# Patient Record
Sex: Male | Born: 1995 | Race: White | Hispanic: No | Marital: Single | Smoking: Current every day smoker
Health system: Southern US, Community
[De-identification: ages and names within clinical notes are randomized; demographics above are authoritative.]

---
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
## 2013-10-15 ENCOUNTER — Emergency Department: Payer: Self-pay | Admitting: Emergency Medicine

## 2014-05-22 ENCOUNTER — Emergency Department: Payer: Self-pay | Admitting: Student

## 2014-05-22 LAB — DRUG SCREEN, URINE
Amphetamines, Ur Screen: NEGATIVE (ref ?–1000)
BARBITURATES, UR SCREEN: NEGATIVE (ref ?–200)
BENZODIAZEPINE, UR SCRN: NEGATIVE (ref ?–200)
Cannabinoid 50 Ng, Ur ~~LOC~~: POSITIVE (ref ?–50)
Cocaine Metabolite,Ur ~~LOC~~: NEGATIVE (ref ?–300)
MDMA (Ecstasy)Ur Screen: NEGATIVE (ref ?–500)
Methadone, Ur Screen: NEGATIVE (ref ?–300)
Opiate, Ur Screen: NEGATIVE (ref ?–300)
Phencyclidine (PCP) Ur S: NEGATIVE (ref ?–25)
TRICYCLIC, UR SCREEN: NEGATIVE (ref ?–1000)

## 2014-05-22 LAB — URINALYSIS, COMPLETE
BILIRUBIN, UR: NEGATIVE
Bacteria: NONE SEEN
Blood: NEGATIVE
Glucose,UR: NEGATIVE mg/dL (ref 0–75)
Ketone: NEGATIVE
Leukocyte Esterase: NEGATIVE
NITRITE: NEGATIVE
Ph: 5 (ref 4.5–8.0)
Protein: NEGATIVE
Specific Gravity: 1.029 (ref 1.003–1.030)
WBC UR: 4 /HPF (ref 0–5)

## 2014-05-22 LAB — ETHANOL

## 2014-05-22 LAB — CBC
HCT: 51.2 % (ref 40.0–52.0)
HGB: 16.3 g/dL (ref 13.0–18.0)
MCH: 26.6 pg (ref 26.0–34.0)
MCHC: 31.9 g/dL — ABNORMAL LOW (ref 32.0–36.0)
MCV: 83 fL (ref 80–100)
Platelet: 192 10*3/uL (ref 150–440)
RBC: 6.14 10*6/uL — AB (ref 4.40–5.90)
RDW: 12.9 % (ref 11.5–14.5)
WBC: 8.3 10*3/uL (ref 3.8–10.6)

## 2014-05-22 LAB — COMPREHENSIVE METABOLIC PANEL
ALK PHOS: 94 U/L
ALT: 34 U/L
Albumin: 4.7 g/dL (ref 3.4–5.0)
Anion Gap: 6 — ABNORMAL LOW (ref 7–16)
BUN: 18 mg/dL (ref 7–18)
Bilirubin,Total: 0.6 mg/dL (ref 0.2–1.0)
CREATININE: 1.08 mg/dL (ref 0.60–1.30)
Calcium, Total: 9.2 mg/dL (ref 8.5–10.1)
Chloride: 105 mmol/L (ref 98–107)
Co2: 25 mmol/L (ref 21–32)
EGFR (African American): >60
EGFR (Non-African Amer.): >60
GLUCOSE: 101 mg/dL — AB (ref 65–99)
OSMOLALITY: 274 (ref 275–301)
Potassium: 4.1 mmol/L (ref 3.5–5.1)
SGOT(AST): 22 U/L (ref 15–37)
SODIUM: 136 mmol/L (ref 136–145)
Total Protein: 8.1 g/dL (ref 6.4–8.2)

## 2014-05-22 LAB — ACETAMINOPHEN LEVEL: Acetaminophen: <2 ug/mL

## 2014-05-22 LAB — SALICYLATE LEVEL: Salicylates, Serum: <1.7 mg/dL

## 2014-09-30 ENCOUNTER — Emergency Department: Payer: Self-pay | Admitting: Emergency Medicine

## 2014-10-02 LAB — HM HIV SCREENING LAB: HM HIV Screening: NEGATIVE

## 2014-10-11 DIAGNOSIS — B009 Herpesviral infection, unspecified: Secondary | ICD-10-CM | POA: Insufficient documentation
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
## 2015-02-01 ENCOUNTER — Encounter: Payer: Self-pay | Admitting: Emergency Medicine

## 2015-02-01 ENCOUNTER — Emergency Department: Payer: Self-pay

## 2015-02-01 ENCOUNTER — Emergency Department
Admission: EM | Admit: 2015-02-01 | Discharge: 2015-02-01 | Disposition: A | Discharge status: Home / Self Care | Payer: Self-pay | Attending: Emergency Medicine | Admitting: Emergency Medicine

## 2015-02-01 DIAGNOSIS — H05231 Hemorrhage of right orbit: Secondary | ICD-10-CM

## 2015-02-01 DIAGNOSIS — Y9389 Activity, other specified: Secondary | ICD-10-CM | POA: Insufficient documentation

## 2015-02-01 DIAGNOSIS — S060X0A Concussion without loss of consciousness, initial encounter: Secondary | ICD-10-CM

## 2015-02-01 DIAGNOSIS — S022XXA Fracture of nasal bones, initial encounter for closed fracture: Secondary | ICD-10-CM

## 2015-02-01 DIAGNOSIS — Z720 Tobacco use: Secondary | ICD-10-CM | POA: Insufficient documentation

## 2015-02-01 DIAGNOSIS — S0121XA Laceration without foreign body of nose, initial encounter: Secondary | ICD-10-CM

## 2015-02-01 DIAGNOSIS — Y998 Other external cause status: Secondary | ICD-10-CM | POA: Insufficient documentation

## 2015-02-01 DIAGNOSIS — Y9289 Other specified places as the place of occurrence of the external cause: Secondary | ICD-10-CM | POA: Insufficient documentation

## 2015-02-01 MED ORDER — ACETAMINOPHEN 500 MG PO TABS
1000.0000 mg | ORAL_TABLET | Freq: Once | ORAL | Status: AC
  Administered 2015-02-01: 1000 mg via ORAL

## 2015-02-01 MED ORDER — ACETAMINOPHEN 500 MG PO TABS
ORAL_TABLET | ORAL | Status: AC
  Filled 2015-02-01: qty 2

## 2015-02-01 MED ORDER — TRAMADOL HCL 50 MG PO TABS: 50.0000 mg | ORAL_TABLET | Freq: Four times a day (QID) | ORAL | Status: DC | PRN

## 2015-02-01 NOTE — ED Notes (Signed)
Pt alert and oriented X4, active, cooperative, pt in NAD. RR even and unlabored, color WNL.  Pt informed to return if any life threatening symptoms occur.   

## 2015-02-01 NOTE — ED Notes (Signed)
Left 20/30 Right 20/25
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
## 2015-02-01 NOTE — Discharge Instructions (Signed)
Concussion A concussion, or closed-head injury, is a brain injury caused by a direct blow to the head or by a quick and sudden movement (jolt) of the head or neck. Concussions are usually not life-threatening. Even so, the effects of a concussion can be serious. If you have had a concussion before, you are more likely to experience concussion-like symptoms after a direct blow to the head.  CAUSES  Direct blow to the head, such as from running into another player during a soccer game, being hit in a fight, or hitting your head on a hard surface.  A jolt of the head or neck that causes the brain to move back and forth inside the skull, such as in a car crash. SIGNS AND SYMPTOMS The signs of a concussion can be hard to notice. Early on, they may be missed by you, family members, and health care providers. You may look fine but act or feel differently. Symptoms are usually temporary, but they may last for days, weeks, or even longer. Some symptoms may appear right away while others may not show up for hours or days. Every head injury is different. Symptoms include:  Mild to moderate headaches that will not go away.  A feeling of pressure inside your head.  Having more trouble than usual:  Learning or remembering things you have heard.  Answering questions.  Paying attention or concentrating.  Organizing daily tasks.  Making decisions and solving problems.  Slowness in thinking, acting or reacting, speaking, or reading.  Getting lost or being easily confused.  Feeling tired all the time or lacking energy (fatigued).  Feeling drowsy.  Sleep disturbances.  Sleeping more than usual.  Sleeping less than usual.  Trouble falling asleep.  Trouble sleeping (insomnia).  Loss of balance or feeling lightheaded or dizzy.  Nausea or vomiting.  Numbness or tingling.  Increased sensitivity to:  Sounds.  Lights.  Distractions.  Vision problems or eyes that tire  easily.  Diminished sense of taste or smell.  Ringing in the ears.  Mood changes such as feeling sad or anxious.  Becoming easily irritated or angry for little or no reason.  Lack of motivation.  Seeing or hearing things other people do not see or hear (hallucinations). DIAGNOSIS Your health care provider can usually diagnose a concussion based on a description of your injury and symptoms. He or she will ask whether you passed out (lost consciousness) and whether you are having trouble remembering events that happened right before and during your injury. Your evaluation might include:  A brain scan to look for signs of injury to the brain. Even if the test shows no injury, you may still have a concussion.  Blood tests to be sure other problems are not present. TREATMENT  Concussions are usually treated in an emergency department, in urgent care, or at a clinic. You may need to stay in the hospital overnight for further treatment.  Tell your health care provider if you are taking any medicines, including prescription medicines, over-the-counter medicines, and natural remedies. Some medicines, such as blood thinners (anticoagulants) and aspirin, may increase the chance of complications. Also tell your health care provider whether you have had alcohol or are taking illegal drugs. This information may affect treatment.  Your health care provider will send you home with important instructions to follow.  How fast you will recover from a concussion depends on many factors. These factors include how severe your concussion is, what part of your brain was injured, your  age, and how healthy you were before the concussion. °· Most people with mild injuries recover fully. Recovery can take time. In general, recovery is slower in older persons. Also, persons who have had a concussion in the past or have other medical problems may find that it takes longer to recover from their current injury. °HOME  CARE INSTRUCTIONS °General Instructions °· Carefully follow the directions your health care provider gave you. °· Only take over-the-counter or prescription medicines for pain, discomfort, or fever as directed by your health care provider. °· Take only those medicines that your health care provider has approved. °· Do not drink alcohol until your health care provider says you are well enough to do so. Alcohol and certain other drugs may slow your recovery and can put you at risk of further injury. °· If it is harder than usual to remember things, write them down. °· If you are easily distracted, try to do one thing at a time. For example, do not try to watch TV while fixing dinner. °· Talk with family members or close friends when making important decisions. °· Keep all follow-up appointments. Repeated evaluation of your symptoms is recommended for your recovery. °· Watch your symptoms and tell others to do the same. Complications sometimes occur after a concussion. Older adults with a brain injury may have a higher risk of serious complications, such as a blood clot on the brain. °· Tell your teachers, school nurse, school counselor, coach, athletic trainer, or work manager about your injury, symptoms, and restrictions. Tell them about what you can or cannot do. They should watch for: °¨ Increased problems with attention or concentration. °¨ Increased difficulty remembering or learning new information. °¨ Increased time needed to complete tasks or assignments. °¨ Increased irritability or decreased ability to cope with stress. °¨ Increased symptoms. °· Rest. Rest helps the brain to heal. Make sure you: °¨ Get plenty of sleep at night. Avoid staying up late at night. °¨ Keep the same bedtime hours on weekends and weekdays. °¨ Rest during the day. Take daytime naps or rest breaks when you feel tired. °· Limit activities that require a lot of thought or concentration. These include: °¨ Doing homework or job-related  work. °¨ Watching TV. °¨ Working on the computer. °· Avoid any situation where there is potential for another head injury (football, hockey, soccer, basketball, martial arts, downhill snow sports and horseback riding). Your condition will get worse every time you experience a concussion. You should avoid these activities until you are evaluated by the appropriate follow-up health care providers. °Returning To Your Regular Activities °You will need to return to your normal activities slowly, not all at once. You must give your body and brain enough time for recovery. °· Do not return to sports or other athletic activities until your health care provider tells you it is safe to do so. °· Ask your health care provider when you can drive, ride a bicycle, or operate heavy machinery. Your ability to react may be slower after a brain injury. Never do these activities if you are dizzy. °· Ask your health care provider about when you can return to work or school. °Preventing Another Concussion °It is very important to avoid another brain injury, especially before you have recovered. In rare cases, another injury can lead to permanent brain damage, brain swelling, or death. The risk of this is greatest during the first 7-10 days after a head injury. Avoid injuries by: °· Wearing a seat   belt when riding in a car.  Drinking alcohol only in moderation.  Wearing a helmet when biking, skiing, skateboarding, skating, or doing similar activities.  Avoiding activities that could lead to a second concussion, such as contact or recreational sports, until your health care provider says it is okay.  Taking safety measures in your home.  Remove clutter and tripping hazards from floors and stairways.  Use grab bars in bathrooms and handrails by stairs.  Place non-slip mats on floors and in bathtubs.  Improve lighting in dim areas. SEEK MEDICAL CARE IF:  You have increased problems paying attention or  concentrating.  You have increased difficulty remembering or learning new information.  You need more time to complete tasks or assignments than before.  You have increased irritability or decreased ability to cope with stress.  You have more symptoms than before. Seek medical care if you have any of the following symptoms for more than 2 weeks after your injury:  Lasting (chronic) headaches.  Dizziness or balance problems.  Nausea.  Vision problems.  Increased sensitivity to noise or light.  Depression or mood swings.  Anxiety or irritability.  Memory problems.  Difficulty concentrating or paying attention.  Sleep problems.  Feeling tired all the time. SEEK IMMEDIATE MEDICAL CARE IF:  You have severe or worsening headaches. These may be a sign of a blood clot in the brain.  You have weakness (even if only in one hand, leg, or part of the face).  You have numbness.  You have decreased coordination.  You vomit repeatedly.  You have increased sleepiness.  One pupil is larger than the other.  You have convulsions.  You have slurred speech.  You have increased confusion. This may be a sign of a blood clot in the brain.  You have increased restlessness, agitation, or irritability.  You are unable to recognize people or places.  You have neck pain.  It is difficult to wake you up.  You have unusual behavior changes.  You lose consciousness. MAKE SURE YOU:  Understand these instructions.  Will watch your condition.  Will get help right away if you are not doing well or get worse. Document Released: 10/30/2003 Document Revised: 08/14/2013 Document Reviewed: 03/01/2013 [facility] Patient Information 2015 [place]. This information is not intended to replace advice given to you by your health care provider. Make sure you discuss any questions you have with your health care provider.  Nasal Fracture A nasal fracture is a break or crack in the  bones of the nose. A minor break usually heals in a month. You often will receive black eyes from a nasal fracture. This is not a cause for concern. The black eyes will go away over 1 to 2 weeks.  DIAGNOSIS  Your caregiver may want to examine you if you are concerned about a fracture of the nose. X-rays of the nose may not show a nasal fracture even when one is present. Sometimes your caregiver must wait 1 to 5 days after the injury to re-check the nose for alignment and to take additional X-rays. Sometimes the caregiver must wait until the swelling has gone down. TREATMENT Minor fractures that have caused no deformity often do not require treatment. More serious fractures where bones are displaced may require surgery. This will take place after the swelling is gone. Surgery will stabilize and align the fracture. HOME CARE INSTRUCTIONS   Put ice on the injured area.  Put ice in a plastic bag.  Place a  towel between your skin and the bag.  Leave the ice on for 15-20 minutes, 03-04 times a day.  Take medications as directed by your caregiver.  Only take over-the-counter or prescription medicines for pain, discomfort, or fever as directed by your caregiver.  If your nose starts bleeding, squeeze the soft parts of the nose against the center wall while you are sitting in an upright position for 10 minutes.  Contact sports should be avoided for at least 3 to 4 weeks or as directed by your caregiver. SEEK MEDICAL CARE IF:  Your pain increases or becomes severe.  You continue to have nosebleeds.  The shape of your nose does not return to normal within 5 days.  You have pus draining from the nose. SEEK IMMEDIATE MEDICAL CARE IF:   You have bleeding from your nose that does not stop after 20 minutes of pinching the nostrils closed and keeping ice on the nose.  You have clear fluid draining from your nose.  You notice a grape-like swelling on the dividing wall between the nostrils  (septum). This is a collection of blood (hematoma) that must be drained to help prevent infection.  You have difficulty moving your eyes.  You have recurrent vomiting. Document Released: 08/06/2000 Document Revised: 11/01/2011 Document Reviewed: 11/23/2010 [facility] Patient Information 2015 [place], [place]. This information is not intended to replace advice given to you by your health care provider. Make sure you discuss any questions you have with your health care provider.

## 2015-06-01 ENCOUNTER — Emergency Department
Admission: EM | Admit: 2015-06-01 | Discharge: 2015-06-01 | Disposition: A | Discharge status: Home / Self Care | Payer: Self-pay | Attending: Emergency Medicine | Admitting: Emergency Medicine

## 2015-06-01 DIAGNOSIS — Z720 Tobacco use: Secondary | ICD-10-CM | POA: Insufficient documentation

## 2015-06-01 DIAGNOSIS — H109 Unspecified conjunctivitis: Secondary | ICD-10-CM | POA: Insufficient documentation

## 2015-06-01 MED ORDER — SULFACETAMIDE SODIUM 10 % OP SOLN: 2.0000 [drp] | Freq: Four times a day (QID) | OPHTHALMIC | Status: DC

## 2015-06-01 NOTE — ED Notes (Signed)
Rt eye red/swelling x 1 week.

## 2015-06-01 NOTE — Discharge Instructions (Signed)

## 2015-06-01 NOTE — ED Provider Notes (Signed)
[facility] Emergency Department Provider Note  ____________________________________________  Time seen: Approximately 11:40 AM  I have reviewed the triage vital signs and the nursing notes.   HISTORY  Chief Complaint Conjunctivitis    HPI Jeffrey Sharp is a 19 y.o. male is a complaints of right flank right eye with greenish discharge times one week. Patient states no pain associated with this. Denies any visual changes.   No past medical history on file.  There are no active problems to display for this patient.   No past surgical history on file.  Current Outpatient Rx  Name  Route  Sig  Dispense  Refill  . sulfacetamide (BLEPH-10) 10 % ophthalmic solution   Both Eyes   Place 2 drops into both eyes 4 (four) times daily.   5 mL   0   . traMADol (ULTRAM) 50 MG tablet   Oral   Take 1 tablet (50 mg total) by mouth every 6 (six) hours as needed.   20 tablet   0     Allergies Review of patient's allergies indicates no known allergies.  No family history on file.  Social History Social History  Substance Use Topics  . Smoking status: Smoker, Current Status Unknown  . Smokeless tobacco: Not on file  . Alcohol Use: No    Review of Systems Constitutional: No fever/chills Eyes: No visual changes. Positive red drainage on the right ENT: No sore throat. Cardiovascular: Denies chest pain. Respiratory: Denies shortness of breath. Gastrointestinal: No abdominal pain.  No nausea, no vomiting.  No diarrhea.  No constipation. Genitourinary: Negative for dysuria. Musculoskeletal: Negative for back pain. Skin: Negative for rash. Neurological: Negative for headaches, focal weakness or numbness.  10-point ROS otherwise negative.  ____________________________________________   PHYSICAL EXAM:  VITAL SIGNS: ED Triage Vitals  Enc Vitals Group     BP 06/01/15 1115 132/84 mmHg     Pulse Rate 06/01/15 1115 70     Resp 06/01/15 1115 18      Temp 06/01/15 1115 98.1 F (36.7 C)     Temp Source 06/01/15 1115 Oral     SpO2 06/01/15 1115 99 %     Weight 06/01/15 1115 140 lb (63.504 kg)     Height 06/01/15 1115  (1.702 m)     Head Cir --      Peak Flow --      Pain Score 06/01/15 1116 3     Pain Loc --      Pain Edu? --      Excl. in GC? --     Constitutional: Alert and oriented. Well appearing and in no acute distress. Eyes: Conjunctivae very erythematous with drainage noted on the right. PERRLA. EOMI. Head: Atraumatic. Nose: No congestion/rhinnorhea. Mouth/Throat: Mucous membranes are moist.  Oropharynx non-erythematous. Neck: No stridor.   Cardiovascular: Normal rate, regular rhythm. Grossly normal heart sounds.  Good peripheral circulation. Respiratory: Normal respiratory effort.  No retractions. Lungs CTAB. Musculoskeletal: No lower extremity tenderness nor edema.  No joint effusions. Neurologic:  Normal speech and language. No gross focal neurologic deficits are appreciated. No gait instability. Skin:  Skin is warm, dry and intact. No rash noted. Psychiatric: Mood and affect are normal. Speech and behavior are normal.  ____________________________________________   LABS (all labs ordered are listed, but only abnormal results are displayed)  Labs Reviewed - No data to display ____________________________________________    PROCEDURES  Procedure(s) performed: None  Critical Care performed: No  ____________________________________________   INITIAL  IMPRESSION / ASSESSMENT AND PLAN / ED COURSE  Pertinent labs & imaging results that were available during my care of the patient were reviewed by me and considered in my medical decision making (see chart for details).  Acute right conjunctivitis. Rx given for sodium Sulamyd eyedrops 10%. Patient follow-up with PCP or return to ER with any worsening symptomology. ____________________________________________   FINAL CLINICAL IMPRESSION(S) / ED  DIAGNOSES  Final diagnoses:  Conjunctivitis of right eye      Evangeline Dakin, PA-C 06/01/15 1143  Governor Rooks, MD 06/01/15 1435

## 2015-06-04 ENCOUNTER — Emergency Department: Payer: Self-pay

## 2015-06-04 ENCOUNTER — Emergency Department
Admission: EM | Admit: 2015-06-04 | Discharge: 2015-06-04 | Disposition: A | Discharge status: Home / Self Care | Payer: Self-pay | Attending: Emergency Medicine | Admitting: Emergency Medicine

## 2015-06-04 ENCOUNTER — Encounter: Payer: Self-pay | Admitting: Emergency Medicine

## 2015-06-04 DIAGNOSIS — M795 Residual foreign body in soft tissue: Secondary | ICD-10-CM

## 2015-06-04 DIAGNOSIS — Y998 Other external cause status: Secondary | ICD-10-CM | POA: Insufficient documentation

## 2015-06-04 DIAGNOSIS — S61240A Puncture wound with foreign body of right index finger without damage to nail, initial encounter: Secondary | ICD-10-CM | POA: Insufficient documentation

## 2015-06-04 DIAGNOSIS — Z720 Tobacco use: Secondary | ICD-10-CM | POA: Insufficient documentation

## 2015-06-04 DIAGNOSIS — W34010A Accidental discharge of airgun, initial encounter: Secondary | ICD-10-CM | POA: Insufficient documentation

## 2015-06-04 DIAGNOSIS — Y9389 Activity, other specified: Secondary | ICD-10-CM | POA: Insufficient documentation

## 2015-06-04 DIAGNOSIS — Y9289 Other specified places as the place of occurrence of the external cause: Secondary | ICD-10-CM | POA: Insufficient documentation

## 2015-06-04 MED ORDER — LIDOCAINE HCL (PF) 1 % IJ SOLN
5.0000 mL | Freq: Once | INTRAMUSCULAR | Status: AC
  Administered 2015-06-04: 5 mL via INTRADERMAL
  Filled 2015-06-04: qty 5

## 2015-06-04 MED ORDER — SULFAMETHOXAZOLE-TRIMETHOPRIM 800-160 MG PO TABS: 1.0000 | ORAL_TABLET | Freq: Two times a day (BID) | ORAL | Status: DC
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
## 2015-06-04 NOTE — Discharge Instructions (Signed)
Puncture Wound °A puncture wound is an injury that is caused by a sharp, thin object that goes through your skin, such as a nail. A puncture wound usually does not leave a large opening in your skin, so it may not bleed a lot. However, when you get a puncture wound, dirt or other materials (foreign bodies) can be forced into your wound and break off inside. This makes it more likely that an infection will happen, such as tetanus. °HOME CARE °Medicines  °· Take or apply over-the-counter and prescription medicines only as told by your doctor. °· If you were prescribed an antibiotic medicine, take or apply it as told by your doctor. Do not stop using the antibiotic even if your condition starts to get better. °Wound Care  °· There are many ways to close and cover a wound. For example, a wound can be covered with stitches (sutures), skin glue, or adhesive strips. Follow instructions from your doctor about: °¨ How to take care of your wound. °¨ When and how you should change your bandage (dressing). °¨ When you should remove your bandage. °¨ Removing whatever was used to close your wound. °· Keep the bandage dry as told by your doctor. Do not take baths, swim, use a hot tub, or do anything that would put your wound underwater until your doctor says it is okay. °· Clean the wound as told by your doctor. °· Do not scratch or pick at the wound. °· Check your wound every day for signs of infection. Watch for: °¨ Redness, swelling, or pain. °¨ Fluid, blood, or pus. °General Instructions  °· Raise (elevate) the injured area above the level of your heart while you are sitting or lying down. °· If your puncture wound is in your foot, ask your doctor if you need to avoid putting weight on your foot and for how long. °· Keep all follow-up visits as told by your doctor. This is important. °GET HELP IF: °· You got a tetanus shot and you have any of these problems at the injection site: °¨ Swelling. °¨ Very bad  pain. °¨ Redness. °¨ Bleeding. °· You have a fever. °· Your stitches come out. °· You notice a bad smell coming from your wound or your bandage. °· You notice something coming out of the wound, such as wood or glass. °· Medicine does not help your pain. °· You have more redness, swelling, or pain at the site of your wound. °· You have fluid, blood, or pus coming from your wound. °· You notice a change in the color of your skin near your wound. °· You need to change the bandage often because fluid, blood, or pus is coming from the wound. °· You start to have a new rash. °· You start to have numbness around the wound. °GET HELP RIGHT AWAY IF: °· You have very bad swelling around the wound. °· Your pain suddenly gets worse and is very bad. °· You start to get painful skin lumps. °· You have a red streak going away from your wound. °· The wound is on your hand or foot and you cannot move a finger or toe like you usually can. °· The wound is on your hand or foot and you notice that your fingers or toes look pale or bluish. °  °This information is not intended to replace advice given to you by your health care provider. Make sure you discuss any questions you have with your health care provider. °  °Document   Released: 05/18/2008 Document Revised: 04/30/2015 Document Reviewed: 10/02/2014 °Elsevier Interactive Patient Education ©2016 [employer] ° °

## 2015-06-21 ENCOUNTER — Emergency Department (HOSPITAL_BASED_OUTPATIENT_CLINIC_OR_DEPARTMENT_OTHER)
Admission: EM | Admit: 2015-06-21 | Discharge: 2015-06-21 | Disposition: A | Discharge status: Home / Self Care | Payer: Self-pay | Attending: Emergency Medicine | Admitting: Emergency Medicine

## 2015-06-21 ENCOUNTER — Encounter (HOSPITAL_BASED_OUTPATIENT_CLINIC_OR_DEPARTMENT_OTHER): Payer: Self-pay | Admitting: *Deleted

## 2015-06-21 ENCOUNTER — Emergency Department (HOSPITAL_BASED_OUTPATIENT_CLINIC_OR_DEPARTMENT_OTHER): Payer: Self-pay

## 2015-06-21 DIAGNOSIS — S40212A Abrasion of left shoulder, initial encounter: Secondary | ICD-10-CM | POA: Insufficient documentation

## 2015-06-21 DIAGNOSIS — Y998 Other external cause status: Secondary | ICD-10-CM | POA: Insufficient documentation

## 2015-06-21 DIAGNOSIS — S51832A Puncture wound without foreign body of left forearm, initial encounter: Secondary | ICD-10-CM | POA: Insufficient documentation

## 2015-06-21 DIAGNOSIS — S41131A Puncture wound without foreign body of right upper arm, initial encounter: Secondary | ICD-10-CM | POA: Insufficient documentation

## 2015-06-21 DIAGNOSIS — S81831A Puncture wound without foreign body, right lower leg, initial encounter: Secondary | ICD-10-CM | POA: Insufficient documentation

## 2015-06-21 DIAGNOSIS — S81032A Puncture wound without foreign body, left knee, initial encounter: Secondary | ICD-10-CM | POA: Insufficient documentation

## 2015-06-21 DIAGNOSIS — Y9389 Activity, other specified: Secondary | ICD-10-CM | POA: Insufficient documentation

## 2015-06-21 DIAGNOSIS — W540XXA Bitten by dog, initial encounter: Secondary | ICD-10-CM | POA: Insufficient documentation

## 2015-06-21 DIAGNOSIS — Z720 Tobacco use: Secondary | ICD-10-CM | POA: Insufficient documentation

## 2015-06-21 DIAGNOSIS — Y9289 Other specified places as the place of occurrence of the external cause: Secondary | ICD-10-CM | POA: Insufficient documentation

## 2015-06-21 MED ORDER — AMOXICILLIN-POT CLAVULANATE 875-125 MG PO TABS
1.0000 | ORAL_TABLET | Freq: Once | ORAL | Status: AC
  Administered 2015-06-21: 1 via ORAL
  Filled 2015-06-21: qty 1

## 2015-06-21 MED ORDER — AMOXICILLIN-POT CLAVULANATE 875-125 MG PO TABS: 1.0000 | ORAL_TABLET | Freq: Two times a day (BID) | ORAL | Status: DC

## 2015-06-21 MED ORDER — OXYCODONE-ACETAMINOPHEN 5-325 MG PO TABS: 1.0000 | ORAL_TABLET | ORAL | Status: DC | PRN

## 2015-06-21 MED ORDER — OXYCODONE-ACETAMINOPHEN 5-325 MG PO TABS
2.0000 | ORAL_TABLET | Freq: Once | ORAL | Status: AC
  Administered 2015-06-21: 2 via ORAL
  Filled 2015-06-21: qty 2

## 2015-06-21 NOTE — ED Notes (Signed)
Patient transported to X-ray via stretcher, sr x 2 up 

## 2015-06-21 NOTE — ED Notes (Signed)
Dog bite sites cleaned with saline/ guaze

## 2015-06-21 NOTE — ED Notes (Addendum)
Dog bite to... Rt medial aspect of lower leg - 2 puncture type wounds noted Rt bicep area:  3 puncture type wounds noted Lt forearm area: 2 puncture type wounds noted Lt deltoid area: large abrasion noted Lt post knee area: 2 puncture wounds noted

## 2015-06-21 NOTE — ED Notes (Signed)
All puncture wound sites irrigated with saline per EDP orders, no further active bleeding noted

## 2015-06-21 NOTE — ED Notes (Signed)
Pt requested that we look at sutures from another encounter elsewhere that were placed 2 weeks ago. Dr. Titus DubinGoldsmith came in to view sutures and removed them.

## 2015-06-21 NOTE — ED Provider Notes (Signed)
CSN: [id number]     Arrival date & time 06/21/15  1132 History   First MD Initiated Contact with Patient 06/21/15 1200     Chief Complaint  Patient presents with  . Animal Bite     (Consider location/radiation/quality/duration/timing/severity/associated sxs/prior Treatment) HPI   Jeffrey Sharp is a 19 y.o. male, pt with no significant medical history, presents with multiple dog bites from an 85lb pitbull.  Dog is currently in custody of [facility] animal control and will be watched for signs of rabies or other illness. Pt rates pain in all puncture wounds to be 7/10, throbbing/sharp, non-radiating. Pt has not tried anything for the pain. Pt denies any bites or injuries other than those listed below.   History reviewed. No pertinent past medical history. History reviewed. No pertinent past surgical history. No family history on file. Social History  Substance Use Topics  . Smoking status: Current Every Day Smoker -- 0.50 packs/day    Types: Cigarettes  . Smokeless tobacco: None  . Alcohol Use: Yes    Review of Systems  Constitutional: Negative for fever, chills and diaphoresis.  Respiratory: Negative for shortness of breath.   Cardiovascular: Negative for chest pain.  Gastrointestinal: Negative for nausea, vomiting and abdominal pain.  Skin: Positive for wound. Negative for color change and pallor.       Multiple puncture wounds on right lower leg, right bicep, left forearm, left deltoid, and left knee.  Neurological: Negative for dizziness, weakness, light-headedness and numbness.  All other systems reviewed and are negative.     Allergies  Review of patient's allergies indicates no known allergies.  Home Medications   Prior to Admission medications   Medication Sig Start Date End Date Taking? Authorizing Provider  amoxicillin-clavulanate (AUGMENTIN) 875-125 MG tablet Take 1 tablet by mouth every 12 (twelve) hours. 06/21/15   Shawn C Joy, PA-C  oxyCODONE-acetaminophen  (PERCOCET/ROXICET) 5-325 MG tablet Take 1-2 tablets by mouth every 4 (four) hours as needed for severe pain. 06/21/15   Shawn C Joy, PA-C   BP 120/86 mmHg  Pulse 70  Temp(Src) 98 F (36.7 C) (Oral)  Resp 18  Ht  (1.702 m)  Wt 145 lb (65.772 kg)  BMI 22.71 kg/m2  SpO2 100% Physical Exam  Constitutional: He appears well-developed and well-nourished. No distress.  HENT:  Head: Normocephalic and atraumatic.  Eyes: Conjunctivae are normal. Pupils are equal, round, and reactive to light.  Neck: Normal range of motion.  Cardiovascular: Normal rate, regular rhythm and intact distal pulses.   Pulmonary/Chest: Effort normal.  Musculoskeletal: Normal range of motion.  Full ROM in all extremities.   Neurological: He is alert.  No sensory deficits. Strength 5/5 in all extremities. Pt has some trouble ambulating due pain in right lower leg.   Skin: Skin is warm and dry. He is not diaphoretic.  Rt medial aspect of lower leg - 2 puncture type wounds Rt bicep area: 3 puncture type wounds Lt forearm area: 2 puncture type wounds Lt deltoid area: large abrasion Lt post knee area: 2 puncture wounds No bites noted to neck, face, hands, or any other area of body other than those listed above. No signs of vessel damage. All bleeding controlled.   Nursing note and vitals reviewed.   ED Course  Procedures (including critical care time) Labs Review Labs Reviewed - No data to display  Imaging Review Dg Forearm Left  06/21/2015  CLINICAL DATA:  Status post dog bites, with multiple soft tissue wounds. EXAM: LEFT FOREARM -  2 VIEW COMPARISON:  None. FINDINGS: No evidence of acute fracture or subluxation. No focal bone lesion or bone destruction. Bone cortex and trabecular architecture appear intact. No radiopaque soft tissue foreign bodies. There is soft tissue swelling and emphysema within the mid arm. IMPRESSION: No acute fracture or dislocation identified about the left forearm. Soft tissue  emphysema and swelling of the mid arm. Electronically Signed   By: Ted Mcalpine M.D.   On: 06/21/2015 12:59   Dg Tibia/fibula Right  06/21/2015  CLINICAL DATA:  Multiple dog bites EXAM: RIGHT TIBIA AND FIBULA - 2 VIEW COMPARISON:  None. FINDINGS: No fracture or dislocation is seen. The joint spaces are preserved. The visualized soft tissues are unremarkable. No radiopaque foreign body is seen. IMPRESSION: No fracture, dislocation, or radiopaque foreign body is seen. Electronically Signed   By: Charline Bills M.D.   On: 06/21/2015 12:53   Dg Knee Complete 4 Views Left  06/21/2015  CLINICAL DATA:  Dog bite.  Initial encounter EXAM: LEFT KNEE - COMPLETE 4+ VIEW COMPARISON:  None. FINDINGS: No fracture of the proximal tibia or distal femur. Patella is normal. No joint effusion. No foreign body IMPRESSION: No fracture or foreign body. Electronically Signed   By: Genevive Bi M.D.   On: 06/21/2015 12:59   Dg Humerus Left  06/21/2015  CLINICAL DATA:  Multiple dog bites EXAM: LEFT HUMERUS - 2+ VIEW COMPARISON:  None. FINDINGS: No fracture or dislocation is seen. The joint spaces are preserved. The visualized soft tissues are unremarkable. No radiopaque foreign body is seen. IMPRESSION: No fracture, dislocation, or radiopaque foreign body is seen. Electronically Signed   By: Charline Bills M.D.   On: 06/21/2015 12:57   Dg Humerus Right  06/21/2015  CLINICAL DATA:  Multiple dog bites to the extremities. EXAM: RIGHT HUMERUS - 2+ VIEW COMPARISON:  None. FINDINGS: No fracture of the RIGHT humerus. Subcutaneous gas noted in the biceps region of the upper arm. No foreign body. IMPRESSION: No fracture foreign body.  Soft tissue injury. Electronically Signed   By: Genevive Bi M.D.   On: 06/21/2015 12:58   I have personally reviewed and evaluated these images and lab results as part of my medical decision-making.   EKG Interpretation None      MDM   Final diagnoses:  Dog bite     Jeffrey Sharp presents with multiple dog bites.  Findings and plan of care discussed with Pricilla Loveless, MD.  No indication for wound closure at this time. Imaging of multiple areas due to breed and size of dog. Will administer augmentin. Dog is being watched by [facility] animal control for signs of rabies. No fracture noted on xrays. Pt pain now controlled. Pt confirms he will be getting a ride home. Pt advised to return to ED or follow up with a PCP for a wound check. Pt given detailed wound care instructions. When receiving discharge instructions, pt asked to have some sutures removed from his right index finger that were placed about 2 weeks ago. Sutures removed by Dr. Criss Alvine.    Anselm Pancoast, PA-C 06/21/15 1315  Anselm Pancoast, PA-C 06/21/15 1354  [facility] pharmacy called to say pt would not get Augmentin prescription filled because it's not on the $4 list. Pharmacist states it is $40 and pt can not afford this. Next closest acceptable ABX would be Azithromycin at $30. Pharmacist checked with pt to see if he could could afford the azithromycin, but pt told her he would just take  the prescription somewhere else.   Anselm PancoastShawn C Joy, PA-C 06/21/15 1427  Pricilla LovelessScott Goldston, MD 06/22/15 [phone]

## 2015-06-21 NOTE — Discharge Instructions (Signed)
You have been seen today for dog bites. Your imaging showed no signs of fracture. Follow up with PCP as needed. Return to ED in 3 days for a wound check or earlier should symptoms worsen.   Emergency Department Resource Guide 1) Find a Doctor and Pay Out of Pocket Although you won't have to find out who is covered by your insurance plan, it is a good idea to ask around and get recommendations. You will then need to call the office and see if the doctor you have chosen will accept you as a new patient and what types of options they offer for patients who are self-pay. Some doctors offer discounts or will set up payment plans for their patients who do not have insurance, but you will need to ask so you aren't surprised when you get to your appointment.  2) Contact Your Local Health Department Not all health departments have doctors that can see patients for sick visits, but many do, so it is worth a call to see if yours does. If you don't know where your local health department is, you can check in your phone book. The CDC also has a tool to help you locate your state's health department, and many state websites also have listings of all of their local health departments.  3) Find a Walk-in Clinic If your illness is not likely to be very severe or complicated, you may want to try a walk in clinic. These are popping up all over the country in pharmacies, drugstores, and shopping centers. They're usually staffed by nurse practitioners or physician assistants that have been trained to treat common illnesses and complaints. They're usually fairly quick and inexpensive. However, if you have serious medical issues or chronic medical problems, these are probably not your best option.  No Primary Care Doctor: - Call Health Connect at  [phone] - they can help you locate a primary care doctor that  accepts your insurance, provides certain services, etc. - Physician Referral Service- [phone]  Chronic  Pain Problems: Organization         Address  Phone   Notes  [facility]  [phone] Patients need to be referred by their primary care doctor.   Medication Assistance: Organization         Address  Phone   Notes  [facility] [address] [place], [place] [address] [phone] --Must be a resident of [facility] [place] -- Must have NO insurance coverage whatsoever (no Medicaid/ Medicare, etc.) -- The pt. MUST have a primary care doctor that directs their care regularly and follows them in the community   MedAssist  [phone]   [employer]  [phone]    Agencies that provide inexpensive medical care: Organization         Address  Phone   Notes  [facility]  [phone]   [facility]    [phone]   [facility] [address] [place], [place] [address] [phone]   [facility] [address], [place] [phone]   [facility]anned [facility]    [phone]   [facility]    [phone]   [facility]  [address], [place] Phone:  [phone], Fax:  [phone]) [phone] Hours of Operation:  9 am - 6 pm, M-F.  Also accepts Medicaid/Medicare and self-pay.  [facility]  [address], [place] Phone: [phone], Fax: [phone]. Hours of Operation:  8:30 am - 5:30 pm, M-F.  Also accepts Medicaid and self-pay.  [facility] [place] [address] [place] Phone: [phone]   [facility] [address] Natasha Bence [place], [place] [phone], Ext. [phone] Mondays & Thursdays: 7-9 AM.  First 15 patients are seen on a first come, first serve basis.    Medicaid-accepting [facility] Providers:  Organization         Address  Phone   Notes  [facility] [address], [place] [phone] Also  accepts self-pay patients.  [facility] [address] Laurell Josephs [place], [place]  [phone]   [facility] [address], [place] [phone]   [facility] [address] [place] [phone]   Renaye Rakers [address], [place]   [phone] Only accepts [place] patients after they have their name applied to their card.   Self-Pay (no insurance) in [facility]:  Organization         Address  Phone   Notes  Sickle Cell Patients, [facility] [address] [place], [place] [phone]   [facility] [address] [place], [place] [phone]   [facility] [place]  [address], [place] [phone]   [facility]/Dr. Osei-Bonsu  [address], [place] or [address], [place] [phone] Phone number for both [place] and [place] locations is the same.  Urgent Medical and [facility] [address], [place] [phone]   [facility] [address], [place] or [address] [phone]   [facility] [address], [place] [phone], phone; [phone], fax Sees patients 1st and 3rd Saturday of every month.  Must not qualify for public or private insurance (i.e. Medicaid, Medicare, [place] Health Choice, Veterans' Benefits)  Household income should be no more than 200% of the poverty level The clinic cannot treat you if you are pregnant or think you are pregnant  Sexually transmitted diseases are not treated at the clinic.    Dental Care: Organization         Address  Phone  Notes  [facility] [address] [place], [place] [phone] Accepts children up to age 13 who are enrolled in [place] or [place] Health Choice; pregnant  women with a Medicaid card; and children who have applied for Medicaid or [place] Health Choice, but were declined, whose parents can pay a reduced fee at time of service.  [facility] Department of [facility]  [address], [place] [phone] Accepts children up to age 24 who are enrolled in [place] or [place] Health Choice; pregnant women with a Medicaid card; and children who have applied for Medicaid or [place] Health Choice, but were declined, whose parents can pay a reduced fee at time of service.  [facility]  [address] [place], [place] [phone] Patients are seen by appointment only. Walk-ins are not accepted. [facility] will see patients 27 years of age and older. Monday - Tuesday (8am-5pm) Most Wednesdays (8:30-5pm) $30 per visit, cash  only  [facility]  [address], [facility] [place] [phone] Patients are seen by appointment only. Walk-ins are not accepted. [facility] will see patients 21 years of age and older. One Wednesday Evening (Monthly: Volunteer Based).  $30 per visit, cash only  [facility] [facility]  [phone] for adults; Children under age 524, call Graduate Pediatric Dentistry at [phone]. Children aged 644-14, please call [phone] to request a pediatric application.  Dental services are provided in all areas of dental care including fillings, crowns and bridges, complete and partial dentures, implants, gum treatment, root canals, and extractions. Preventive care is also provided. Treatment is provided to both adults and children. Patients are selected via a lottery and there is often a waiting list.   [facility] [address], [place]  [phone] [email or web address]   [facility] [address], [place], [place] [phone], Ext. [phone] Second and Fourth Thursday of each month, opens at 6:30 AM; Clinic ends at 9 AM.  Patients are  seen on a first-come first-served basis, and a limited number are seen during each clinic.   [facility]  [address], [place], [place] [phone]) [phone]   Eligibility Requirements You must have lived in [place], [place], or [place] counties for at least the last three months.   You cannot be eligible for state or federal sponsored National Cityhealthcare insurance, including CIGNAVeterans Administration, [place]icaid, or [employer]icare.   You generally cannot be eligible for healthcare insurance through your employer.    How to apply: Eligibility screenings are held every Tuesday and Wednesday afternoon from 1:00 pm until 4:00 pm. You do not need an appointment for the interview!  [facility] [address], [place], [place] [phone]   [facility]  [phone]   [facility]  [phone]   [facility]  [phone]    Behavioral Health Resources in the Community: Intensive Outpatient Programs Organization         Address  Phone  Notes  [facility] [address], [place], [place] [phone]   [facility] [address], [place], [place] [phone]   [facility] [address], [place], [place]  [phone]   [facility] [address],  [place], [place] [phone] or [phone]   Substance Abuse Resources Organization         Address  Phone  Notes  [facility]  [phone]   [facility]  [phone]   [facility]  [phone]   [facility]  [phone]   Residential & Outpatient Substance Abuse Program  [phone]   Psychological Services Organization         Address  Phone  Notes  [facility]  [phone]   [facility]  [phone]   [facility] [address], [place] [phone] or [phone]    Mobile Crisis  Teams Organization         Address  Phone  Notes  [facility], Mobile Crisis Care Unit  [phone]   [facility]  [address] [place], [place] [phone]   Doristine LocksSharon DeEsch [address] [place] [phone]    Self-Help/Support Groups Organization         Address  Phone  Notes  [facility] [place] - variety of support groups  Century Call for more information  Narcotics Anonymous (NA), [facility] [address], [employer] [place]  2 meetings at this location   Special educational needs teacher         Address  Phone  Notes  [facility] [place],    [place]  [phone]   [facility]  [address], [place] [phone], [place], [place]   [place], [place] [phone] Admissions: 8am-3pm M-F  Incentives Substance [place] [address],    [place], [place] [phone]   [facility] [address] [place], [place], [place]   [facility] [address],  [place], [place]   [facility] - Intensive Outpatient [address] Kristeen [address], [place], [place]   [facility] ([place] [place]  [place], [place] [phone] or [phone]   [facility] ([facility]) [address], [place], [place] Accepts Medicaid  [facility] [address]  [place] [phone] Substance Abuse/Addiction Treatment   [facility] Organization         Address  Phone  Notes  [facility]  [phone]   Domenic Schwab, PhD [address] Arlis Porta [place], [place]   [phone] or [phone]   Lockhart [place], [place] [phone]   [facility] [address] [place], [place] [phone]  Insurance/Medicaid/sponsorship through [facility] and Families [address], [address] [place]                                    [place], [place] [phone] [place] [address], [place] [phone]    Dr. Adele Schilder  [phone]   [facility]. 1) [address], [place] 2) [place] 3)  [address], [place] [phone]  [phone]   [place] [phone] or [phone] (After Hours)

## 2015-06-21 NOTE — ED Notes (Signed)
MD at bedside. 

## 2015-06-21 NOTE — ED Notes (Signed)
Puncture wound noted on left hand

## 2015-06-21 NOTE — ED Notes (Signed)
Left forearm puncture site to be actively bleeding, dsg applied, bleeding controlled

## 2015-06-21 NOTE — ED Notes (Signed)
Dog bite by [age]year old boxer/ pitbull mix, pts own dog, pt states has been owner of this dog for approx 2 weeks

## 2015-06-21 NOTE — ED Notes (Signed)
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

## 2015-06-28 ENCOUNTER — Encounter: Payer: Self-pay | Admitting: Emergency Medicine

## 2016-07-04 IMAGING — DX DG HUMERUS 2V *L*
2 series · 2 of 2 positions shown · non-contrast
Comparison: None.

CLINICAL DATA: Multiple dog bites

EXAM:
LEFT HUMERUS - 2+ VIEW

[humerus ap]
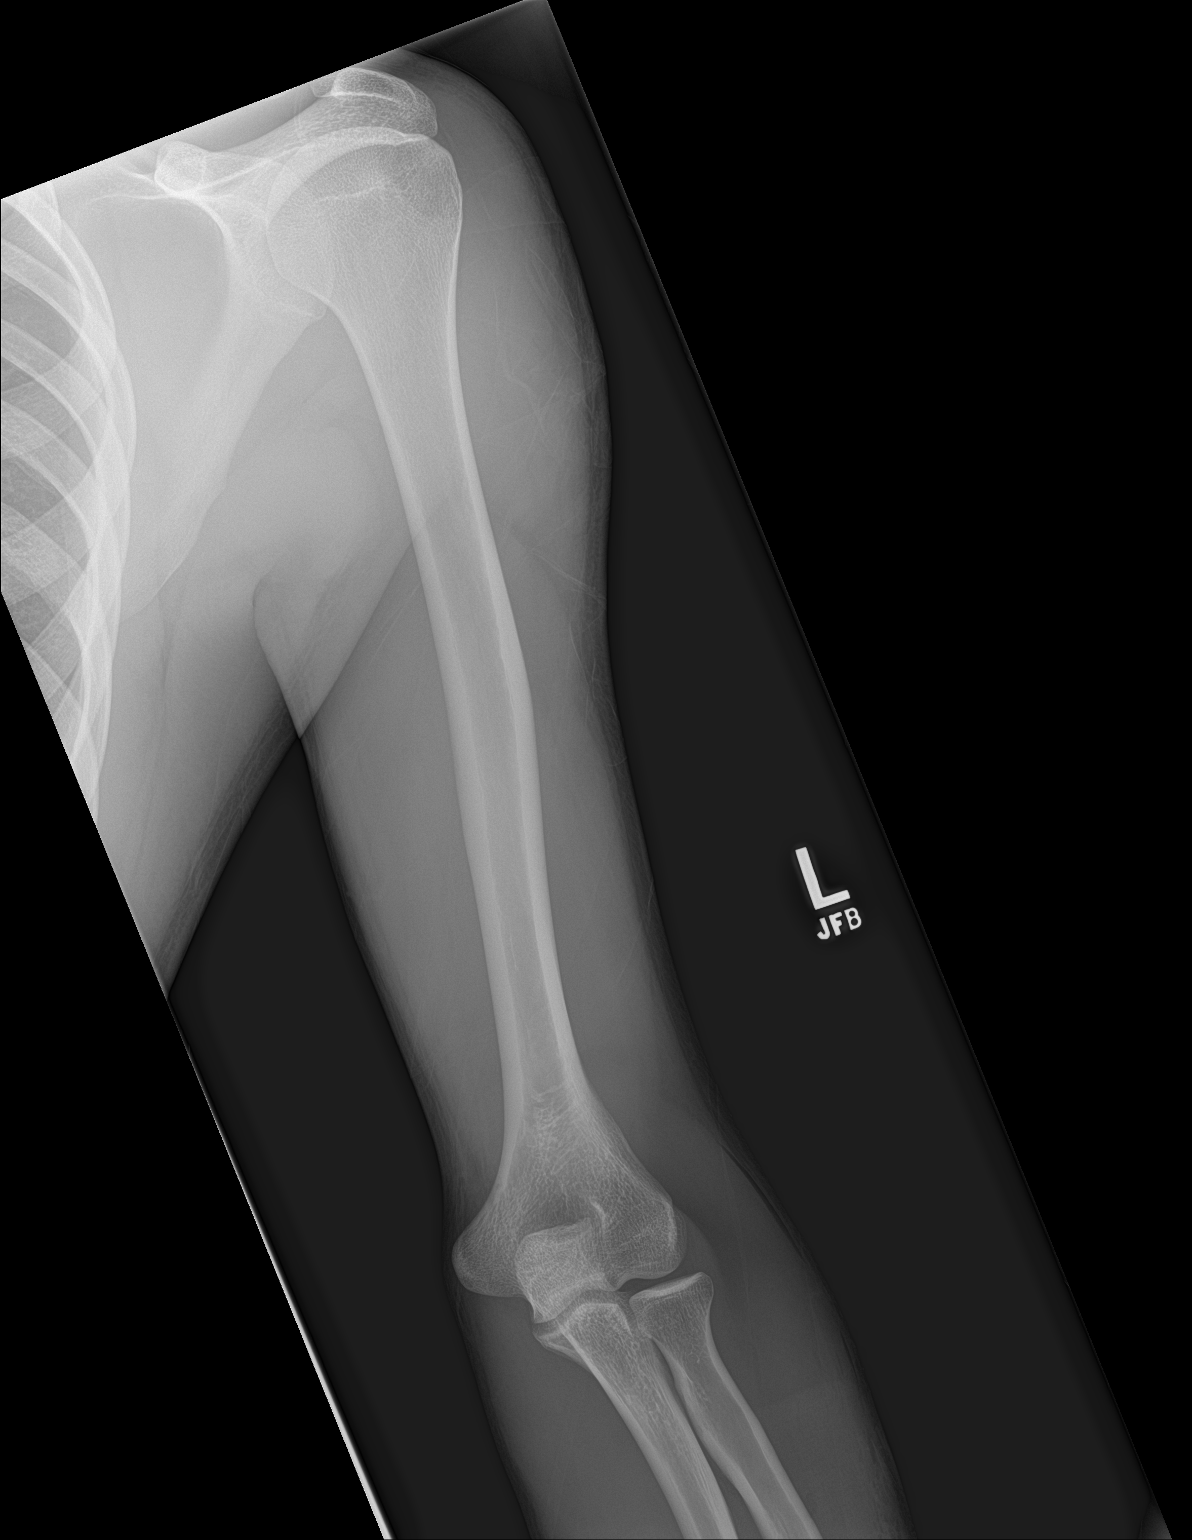

[humerus lat]
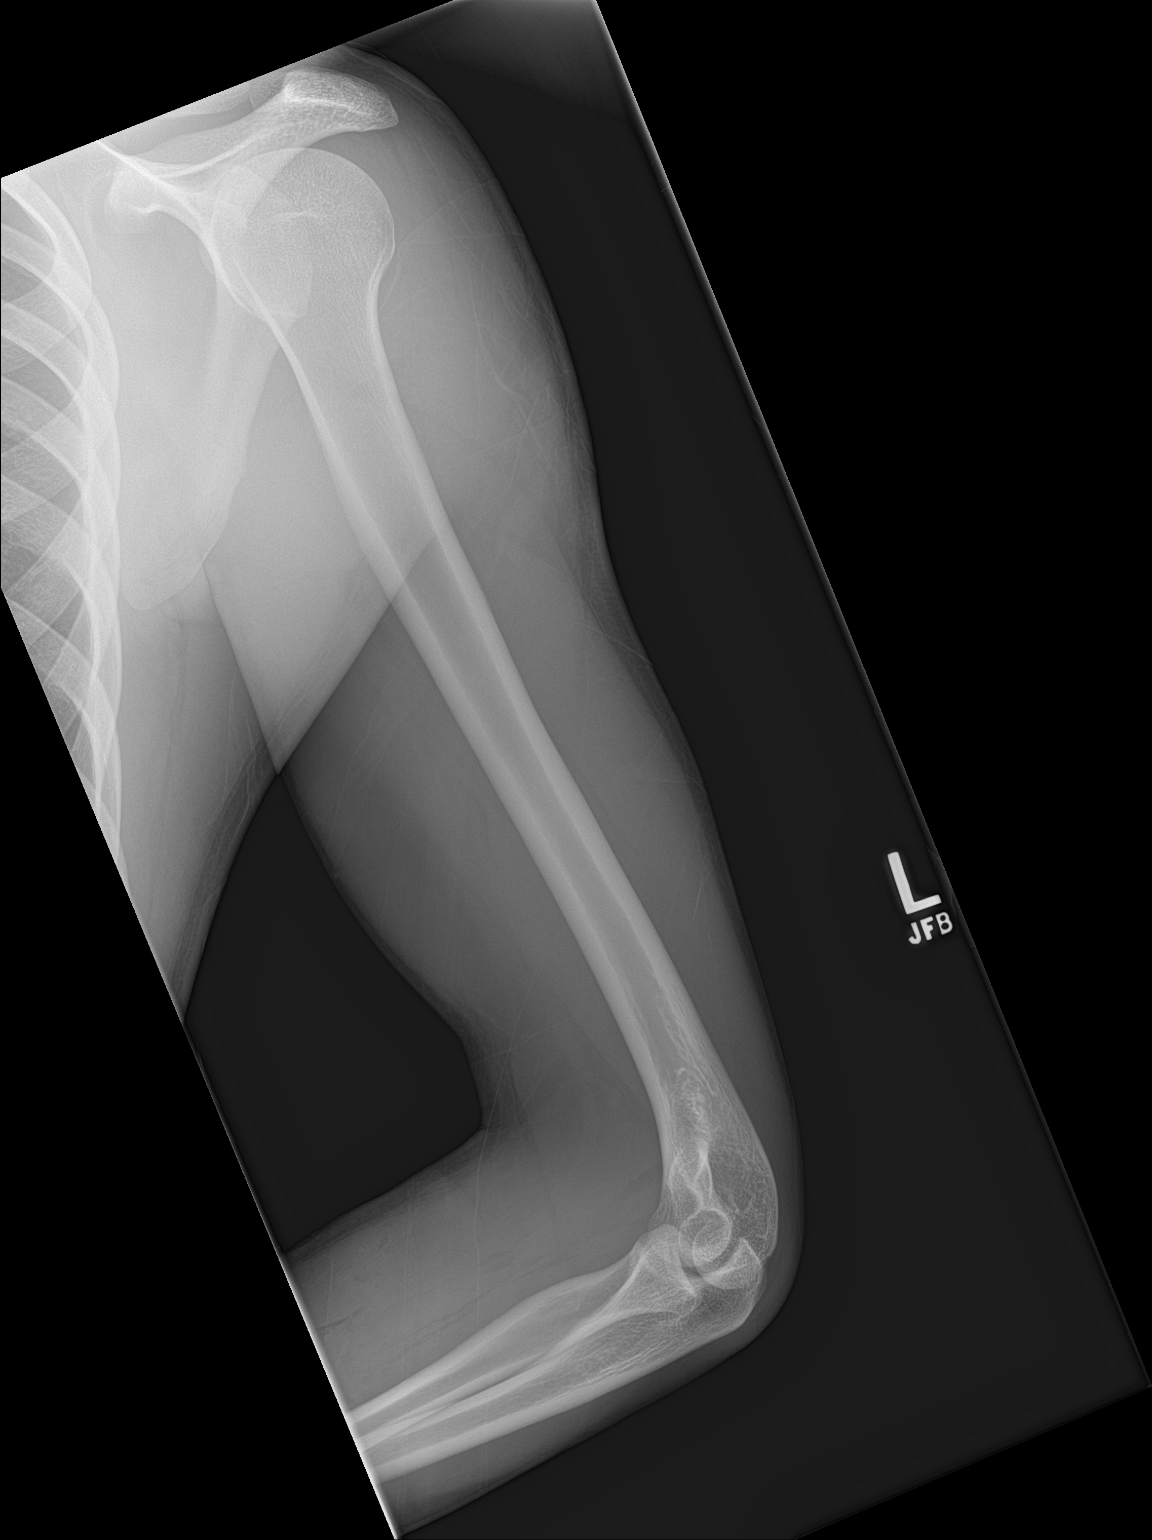

[2 of 2 positions shown; findings below may reference images not displayed]

FINDINGS: No fracture or dislocation is seen.

The joint spaces are preserved.

The visualized soft tissues are unremarkable.

No radiopaque foreign body is seen.
IMPRESSION: No fracture, dislocation, or radiopaque foreign body is seen.

## 2017-01-09 ENCOUNTER — Emergency Department
Admission: EM | Admit: 2017-01-09 | Discharge: 2017-01-09 | Disposition: A | Discharge status: Home / Self Care | Payer: Self-pay | Attending: Emergency Medicine | Admitting: Emergency Medicine

## 2017-01-09 ENCOUNTER — Emergency Department: Payer: Self-pay

## 2017-01-09 ENCOUNTER — Encounter: Payer: Self-pay | Admitting: Emergency Medicine

## 2017-01-09 DIAGNOSIS — S92415A Nondisplaced fracture of proximal phalanx of left great toe, initial encounter for closed fracture: Secondary | ICD-10-CM | POA: Insufficient documentation

## 2017-01-09 DIAGNOSIS — W2102XA Struck by soccer ball, initial encounter: Secondary | ICD-10-CM | POA: Insufficient documentation

## 2017-01-09 DIAGNOSIS — Y999 Unspecified external cause status: Secondary | ICD-10-CM | POA: Insufficient documentation

## 2017-01-09 DIAGNOSIS — Y9366 Activity, soccer: Secondary | ICD-10-CM | POA: Insufficient documentation

## 2017-01-09 DIAGNOSIS — F17210 Nicotine dependence, cigarettes, uncomplicated: Secondary | ICD-10-CM | POA: Insufficient documentation

## 2017-01-09 DIAGNOSIS — Y929 Unspecified place or not applicable: Secondary | ICD-10-CM | POA: Insufficient documentation

## 2017-01-09 MED ORDER — KETOROLAC TROMETHAMINE 30 MG/ML IJ SOLN
30.0000 mg | Freq: Once | INTRAMUSCULAR | Status: AC
  Administered 2017-01-09: 30 mg via INTRAMUSCULAR
  Filled 2017-01-09: qty 1

## 2017-01-09 MED ORDER — TRAMADOL HCL 50 MG PO TABS: 50.0000 mg | ORAL_TABLET | Freq: Four times a day (QID) | ORAL | 0 refills | Status: AC | PRN

## 2017-01-09 NOTE — ED Notes (Signed)
Pt says he just moved in with his brother; there were scattered soccer balls of different colors in the yard; pt went to kick the ball and felt pain in his left great toe; pt then realized he had kicked a bowling ball instead; purplish discoloration/pain/swelling present; pt declined wheelchair to treatment room; says he's been walking on it since the incident this afternoon, but pain is increasing

## 2017-01-09 NOTE — ED Provider Notes (Signed)
[facility] Emergency Department Provider Note   ____________________________________________   I have reviewed the triage vital signs and the nursing notes.   HISTORY  Chief Complaint Toe Injury    HPI Jeffrey Sharp is a 21 y.o. male presents with left great toe pain. Patient reports attempting to kick what he thought was a soccer ball then realizing it was a bowling ball. Patient noted immediate onset of pain followed by bruising along the left great toe. Patient able to ambulate although with pain. Patient denies any past injury to the left foot or great toe. No open wounds or injuries noted.  History reviewed. No pertinent past medical history.  There are no active problems to display for this patient.   History reviewed. No pertinent surgical history.  Prior to Admission medications   Not on File    Allergies Patient has no known allergies.  History reviewed. No pertinent family history.  Social History Social History  Substance Use Topics  . Smoking status: Current Every Day Smoker    Packs/day: 0.50    Types: Cigarettes  . Smokeless tobacco: Never Used  . Alcohol use Yes    Review of Systems Constitutional:  Negative for fever/chills Eyes: No visual changes. Cardiovascular: Denies chest pain. Respiratory:Denies shortness of breath. Gastrointestinal: No abdominal pain. Musculoskeletal: Left great toe pain, difficulty walking Skin: Negative for rash. Neurological: Negative focal weakness or numbness. Able to ambulate. ____________________________________________   PHYSICAL EXAM:  VITAL SIGNS: ED Triage Vitals  Enc Vitals Group     BP 01/09/17 2236 129/80     Pulse Rate 01/09/17 2236 64     Resp 01/09/17 2236 18     Temp 01/09/17 2236 97.9 F (36.6 C)     Temp Source 01/09/17 2236 Oral     SpO2 01/09/17 2236 100 %     Weight 01/09/17 2237 150 lb (68 kg)     Height 01/09/17 2237 5\' 7"  (1.702 m)     Head Circumference  --      Peak Flow --      Pain Score 01/09/17 2236 7     Pain Loc --      Pain Edu? --      Excl. in GC? --     Constitutional: Alert and oriented. Well appearing and in no acute distress.  Head: Normocephalic and atraumatic. Eyes: Conjunctivae are normal.  Cardiovascular: Normal rate, regular rhythm. Normal distal pulses. Respiratory: Normal respiratory effort.  Musculoskeletal: Left great toe pain, ecchymosis and difficulty walking consistent with fracture. Neurologic: Normal speech and language.  Skin:  Skin is warm, dry and intact. No rash noted. Psychiatric: Mood and affect are normal. Patient exhibits appropriate insight and judgment. ____________________________________________   LABS (all labs ordered are listed, but only abnormal results are displayed)  Labs Reviewed - No data to display ____________________________________________  EKG none ____________________________________________  RADIOLOGY DG left great toe FINDINGS: Oblique fracture of the mid to distal aspect of the first proximal phalanx with extension to the articular surface. Mild associated soft tissue swelling.  IMPRESSION: Oblique fracture of the first proximal phalanx with extension to the articular surface. ____________________________________________   PROCEDURES  Procedure(s) performed:  SPLINT APPLICATION Date/Time: 11:33 PM Authorized by: Clois Comberraci M Yandell Mcjunkins Consent: Verbal consent obtained. Risks and benefits: risks, benefits and alternatives were discussed Consent given by: patient Splint applied by: Agam Davenport, PA-C Location details: Left great toe Splint type: Buddy tape with cast shoe Supplies used: Tape, cover roll and cast  shoe Post-procedure: The splinted body part was neurovascularly unchanged following the procedure. Patient tolerance: Patient tolerated the procedure well with no immediate complications.    Critical Care performed:  no ____________________________________________   INITIAL IMPRESSION / ASSESSMENT AND PLAN / ED COURSE  Pertinent labs & imaging results that were available during my care of the patient were reviewed by me and considered in my medical decision making (see chart for details).  Patient presents with left great toe fracture after kicking a bowling ball earlier this evening. Physical exam and imaging confirm fracture of proximal phalanx of the great toe. The great toe was buddy taped to the adjacent toe and then placed in a cast shoe for stabilization. Neurovascular assessment performed and intact before and after immobilization. Patient noted reduction of pain and symptoms following Toradol. Patient reported having to drive himself home this evening. Patient will be given a prescription for tramadol for pain management once he is discharged. Patient will be referred to orthopedics for continued care   Patient  informed of clinical course, understand medical decision-making process, and agree with plan.  Patient was advised to follow up Orthopedics and was also advised to return to the emergency department for symptoms that change or worsen if unable to schedule an appointment.      If controlled substance prescribed during this visit, 12 month history viewed on the NCCSRS prior to issuing an initial prescription for Schedule II or III opiod. ____________________________________________   FINAL CLINICAL IMPRESSION(S) / ED DIAGNOSES  Final diagnoses:  None       NEW MEDICATIONS STARTED DURING THIS VISIT:  New Prescriptions   No medications on file     Note:  This document was prepared using Dragon voice recognition software and may include unintentional dictation errors.   Percell [place] 01/09/17 2334    Sharman Cheek, MD 01/10/17 2205

## 2017-01-09 NOTE — ED Triage Notes (Signed)
Pt c/o left great toe pain after accidentally kicking a bowling bowl; purplish discoloration to area; pain/swelling present; pt says it happened earlier in the day, and he's been able to ambulate on his foot, but pain has increased

## 2017-09-23 IMAGING — DX DG TOE GREAT 2+V*L*
3 series · 3 of 3 positions shown · non-contrast
Comparison: None.

CLINICAL DATA: Left great toe pain after kicking ball.

EXAM:
LEFT GREAT TOE

[toe ap]
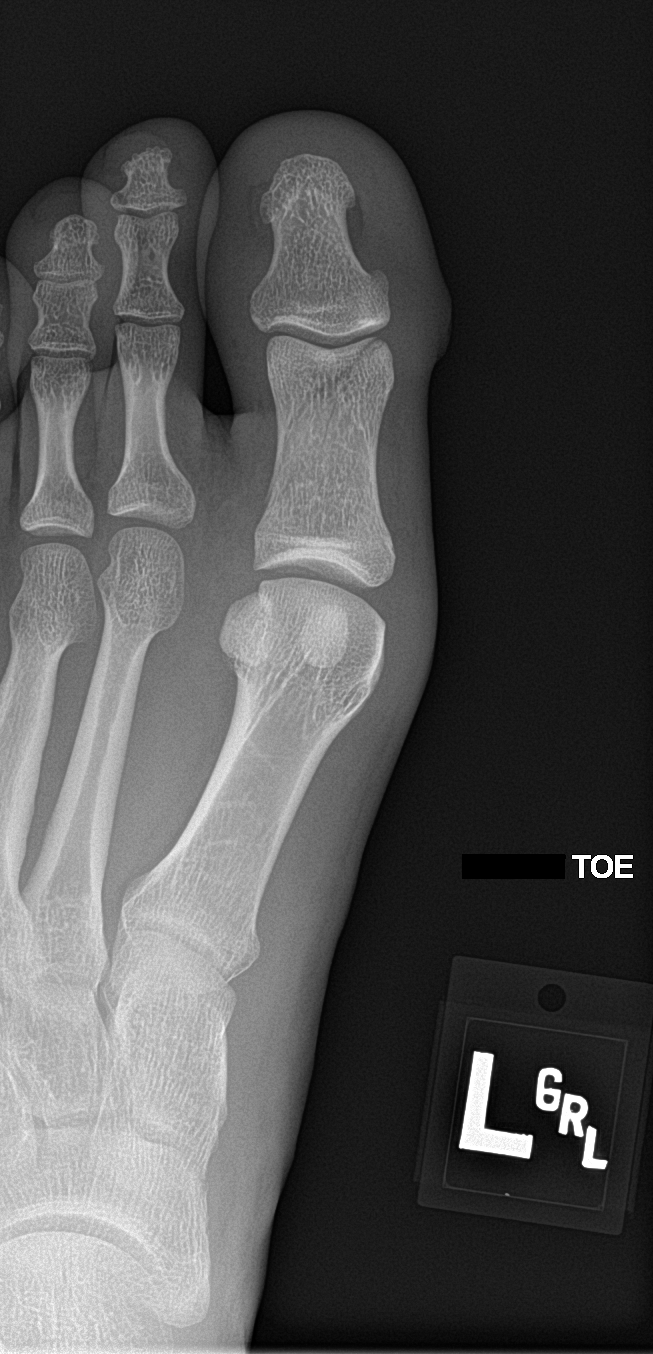

[toe obl]
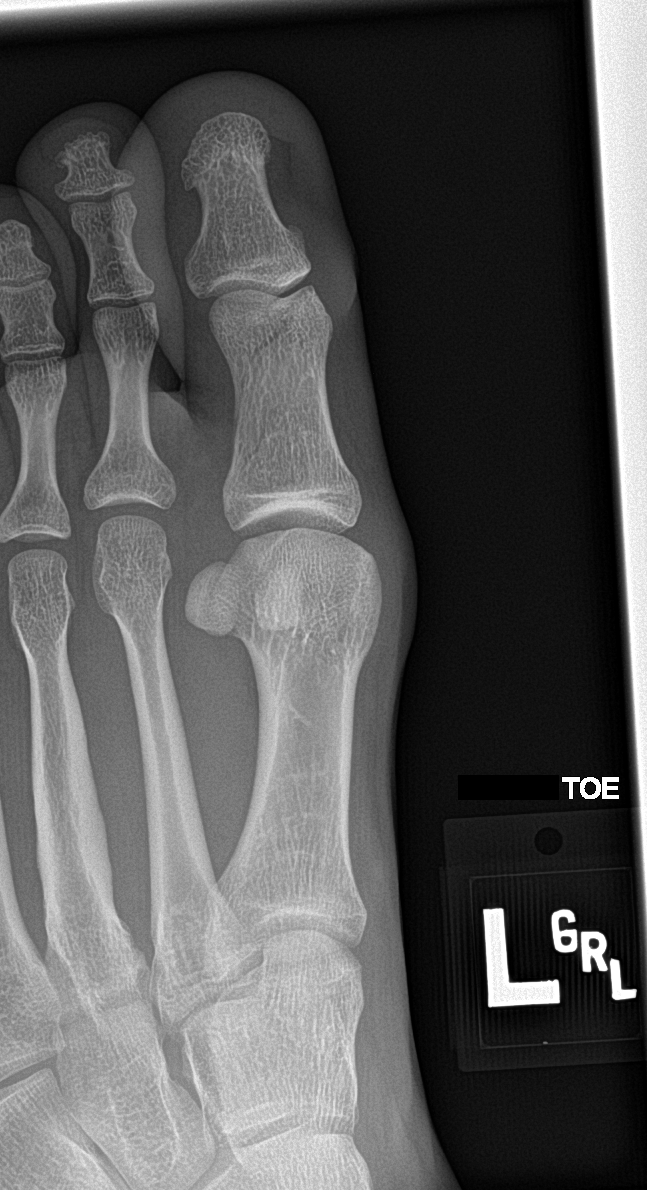

[toe lat]
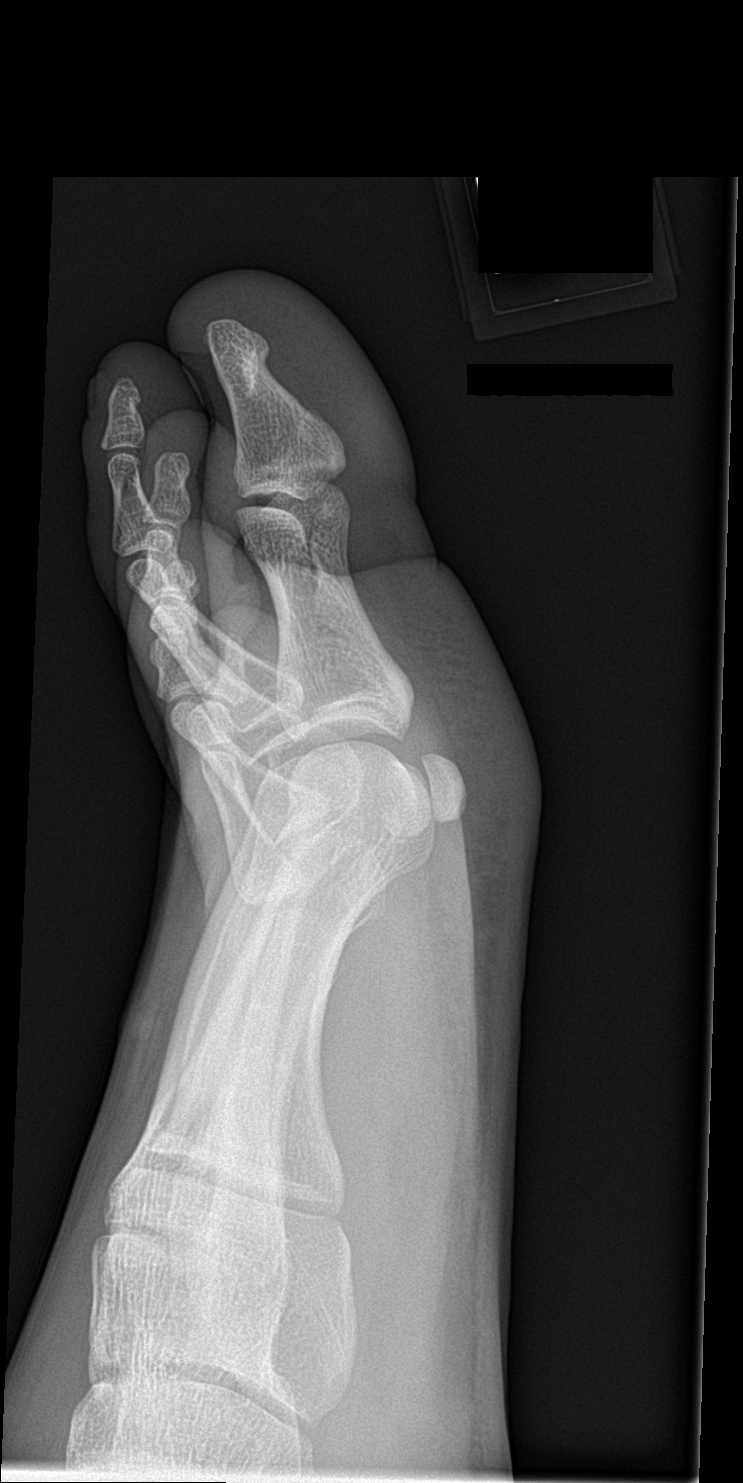

[3 of 3 positions shown; findings below may reference images not displayed]

FINDINGS: Oblique fracture of the mid to distal aspect of the first proximal
phalanx with extension to the articular surface. Mild associated
soft tissue swelling.
IMPRESSION: Oblique fracture of the first proximal phalanx with extension to the
articular surface.

## 2019-08-29 DIAGNOSIS — B009 Herpesviral infection, unspecified: Secondary | ICD-10-CM

## 2019-08-30 ENCOUNTER — Ambulatory Visit: Payer: Self-pay

## 2023-01-05 ENCOUNTER — Emergency Department
Admission: EM | Admit: 2023-01-05 | Discharge: 2023-01-05 | Disposition: A | Discharge status: Home / Self Care | Payer: Self-pay | Attending: Student in an Organized Health Care Education/Training Program | Admitting: Student in an Organized Health Care Education/Training Program

## 2023-01-05 ENCOUNTER — Encounter: Payer: Self-pay | Admitting: Emergency Medicine

## 2023-01-05 DIAGNOSIS — R1110 Vomiting, unspecified: Secondary | ICD-10-CM | POA: Insufficient documentation

## 2023-01-05 DIAGNOSIS — R1111 Vomiting without nausea: Secondary | ICD-10-CM

## 2023-01-05 LAB — CBC
HCT: 55.4 % — ABNORMAL HIGH (ref 39.0–52.0)
Hemoglobin: 18.1 g/dL — ABNORMAL HIGH (ref 13.0–17.0)
MCH: 27.4 pg (ref 26.0–34.0)
MCHC: 32.7 g/dL (ref 30.0–36.0)
MCV: 83.8 fL (ref 80.0–100.0)
Platelets: 263 10*3/uL (ref 150–400)
RBC: 6.61 MIL/uL — ABNORMAL HIGH (ref 4.22–5.81)
RDW: 12.3 % (ref 11.5–15.5)
WBC: 7.3 10*3/uL (ref 4.0–10.5)
nRBC: 0 % (ref 0.0–0.2)

## 2023-01-05 LAB — COMPREHENSIVE METABOLIC PANEL
ALT: 15 U/L (ref 0–44)
AST: 17 U/L (ref 15–41)
Albumin: 4.1 g/dL (ref 3.5–5.0)
Alkaline Phosphatase: 50 U/L (ref 38–126)
Anion gap: 9 (ref 5–15)
BUN: 15 mg/dL (ref 6–20)
CO2: 24 mmol/L (ref 22–32)
Calcium: 8.6 mg/dL — ABNORMAL LOW (ref 8.9–10.3)
Chloride: 106 mmol/L (ref 98–111)
Creatinine, Ser: 1.08 mg/dL (ref 0.61–1.24)
GFR, Estimated: >60 mL/min (ref >60)
Glucose, Bld: 70 mg/dL (ref 70–99)
Potassium: 4.2 mmol/L (ref 3.5–5.1)
Sodium: 139 mmol/L (ref 135–145)
Total Bilirubin: 0.8 mg/dL (ref 0.3–1.2)
Total Protein: 6.2 g/dL — ABNORMAL LOW (ref 6.5–8.1)

## 2023-01-05 LAB — URINALYSIS, ROUTINE W REFLEX MICROSCOPIC
Bilirubin Urine: NEGATIVE
Glucose, UA: NEGATIVE mg/dL
Hgb urine dipstick: NEGATIVE
Ketones, ur: NEGATIVE mg/dL
Leukocytes,Ua: NEGATIVE
Nitrite: NEGATIVE
Protein, ur: 30 mg/dL — AB
Specific Gravity, Urine: 1.029 (ref 1.005–1.030)
Squamous Epithelial / HPF: NONE SEEN /HPF (ref 0–5)
pH: 7 (ref 5.0–8.0)

## 2023-01-05 LAB — LIPASE, BLOOD: Lipase: 28 U/L (ref 11–51)

## 2023-01-05 NOTE — ED Triage Notes (Signed)
Pt presents ambulatory to triage via POV with complaints of emesis that occurred today. Pt states that he had a stomach bug from something he ate today and is now better but his employer is wanting him to provide a note. A&Ox4 at this time. Denies abdominal pain, diarrhea, cough, fever, CP or SOB.

## 2023-01-05 NOTE — ED Provider Notes (Signed)
[facility] Provider Note    Event Date/Time   First MD Initiated Contact with Patient 01/05/23 2232     (approximate)   History   Emesis   HPI  Jeffrey Sharp is a 27 y.o. male previously healthy presents to the ER for evaluation of 1 episode of emesis that occurred this morning.  Did feel nauseated when he woke up to go to work.  Denies any pain.  Had 1 episode of none bloody nonmelanotic emesis.  He called out of work.  Did donate plasma today.  He feels well at this time denies any abdominal pain no fevers no chills.  Does not feel nauseated he is currently eating a bag of chips.  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     Physical Exam   Triage Vital Signs: ED Triage Vitals [01/05/23 2004]  Enc Vitals Group     BP (!) 151/96     Pulse Rate 89     Resp 18     Temp 99.6 F (37.6 C)     Temp src      SpO2 100 %     Weight 152 lb 8.9 oz (69.2 kg)     Height 5\' 7"  (1.702 m)     Head Circumference      Peak Flow      Pain Score 0     Pain Loc      Pain Edu?      Excl. in GC?     Most recent vital signs: Vitals:   01/05/23 2004  BP: (!) 151/96  Pulse: 89  Resp: 18  Temp: 99.6 F (37.6 C)  SpO2: 100%     Constitutional: Alert  Eyes: Conjunctivae are normal.  Head: Atraumatic. Nose: No congestion/rhinnorhea. Mouth/Throat: Mucous membranes are moist.   Neck: Painless ROM.  Cardiovascular:   Good peripheral circulation. Respiratory: Normal respiratory effort.  No retractions.  Gastrointestinal: Soft and nontender in all four quadrants Musculoskeletal:  no deformity Neurologic:  MAE spontaneously. No gross focal neurologic deficits are appreciated.      ED Results / Procedures / Treatments   Labs (all labs ordered are listed, but only abnormal results are displayed) Labs Reviewed  COMPREHENSIVE METABOLIC PANEL - Abnormal; Notable for the following components:      Result Value    Calcium 8.6 (*)    Total Protein 6.2 (*)    All other components within normal limits  CBC - Abnormal; Notable for the following components:   RBC 6.61 (*)    Hemoglobin 18.1 (*)    HCT 55.4 (*)    All other components within normal limits  URINALYSIS, ROUTINE W REFLEX MICROSCOPIC - Abnormal; Notable for the following components:   Color, Urine YELLOW (*)    APPearance CLOUDY (*)    Protein, ur 30 (*)    Bacteria, UA FEW (*)    All other components within normal limits  LIPASE, BLOOD     EKG     RADIOLOGY    PROCEDURES:  Critical Care performed: No  Procedures   MEDICATIONS ORDERED IN ED: Medications - No data to display   IMPRESSION / MDM / ASSESSMENT AND PLAN / ED COURSE  I reviewed the triage vital signs and the nursing notes.  Differential diagnosis includes, but is not limited to, duration, enteritis, foodborne illness, pancreatitis, electrolyte abnormality  Patient arrives no acute distress well-appearing after episode of emesis this morning.  Now asymptomatic.  Blood work does appear hemoconcentrated likely secondary to donating plasma.  He is tolerating p.o.  Do not feel that he requires IV fluids.  Will give work note.  Does appear appropriate for discharge.       FINAL CLINICAL IMPRESSION(S) / ED DIAGNOSES   Final diagnoses:  Vomiting without nausea, unspecified vomiting type     Rx / DC Orders   ED Discharge Orders     None        Note:  This document was prepared using Dragon voice recognition software and may include unintentional dictation errors.    Willy Eddy, MD 01/05/23 2237

## 2023-11-25 ENCOUNTER — Other Ambulatory Visit: Payer: Self-pay

## 2023-11-25 ENCOUNTER — Emergency Department
Admission: EM | Admit: 2023-11-25 | Discharge: 2023-11-25 | Disposition: A | Discharge status: Home / Self Care | Attending: Emergency Medicine | Admitting: Emergency Medicine

## 2023-11-25 DIAGNOSIS — M7032 Other bursitis of elbow, left elbow: Secondary | ICD-10-CM | POA: Insufficient documentation

## 2023-11-25 DIAGNOSIS — M25522 Pain in left elbow: Secondary | ICD-10-CM | POA: Diagnosis present

## 2023-11-25 DIAGNOSIS — Y9389 Activity, other specified: Secondary | ICD-10-CM | POA: Insufficient documentation

## 2023-11-25 LAB — CBC WITH DIFFERENTIAL/PLATELET
Abs Immature Granulocytes: 0.01 10*3/uL (ref 0.00–0.07)
Basophils Absolute: 0.1 10*3/uL (ref 0.0–0.1)
Basophils Relative: 1 %
Eosinophils Absolute: 0.4 10*3/uL (ref 0.0–0.5)
Eosinophils Relative: 6 %
HCT: 43.8 % (ref 39.0–52.0)
Hemoglobin: 14.7 g/dL (ref 13.0–17.0)
Immature Granulocytes: 0 %
Lymphocytes Relative: 26 %
Lymphs Abs: 1.7 10*3/uL (ref 0.7–4.0)
MCH: 27.1 pg (ref 26.0–34.0)
MCHC: 33.6 g/dL (ref 30.0–36.0)
MCV: 80.7 fL (ref 80.0–100.0)
Monocytes Absolute: 0.4 10*3/uL (ref 0.1–1.0)
Monocytes Relative: 6 %
Neutro Abs: 4.2 10*3/uL (ref 1.7–7.7)
Neutrophils Relative %: 61 %
Platelets: 224 10*3/uL (ref 150–400)
RBC: 5.43 MIL/uL (ref 4.22–5.81)
RDW: 12.6 % (ref 11.5–15.5)
WBC: 6.8 10*3/uL (ref 4.0–10.5)
nRBC: 0 % (ref 0.0–0.2)

## 2023-11-25 LAB — BASIC METABOLIC PANEL WITH GFR
Anion gap: 5 (ref 5–15)
BUN: 14 mg/dL (ref 6–20)
CO2: 27 mmol/L (ref 22–32)
Calcium: 9.1 mg/dL (ref 8.9–10.3)
Chloride: 105 mmol/L (ref 98–111)
Creatinine, Ser: 1.05 mg/dL (ref 0.61–1.24)
GFR, Estimated: >60 mL/min (ref >60)
Glucose, Bld: 88 mg/dL (ref 70–99)
Potassium: 3.7 mmol/L (ref 3.5–5.1)
Sodium: 137 mmol/L (ref 135–145)

## 2023-11-25 LAB — LACTIC ACID, PLASMA: Lactic Acid, Venous: 0.9 mmol/L (ref 0.5–1.9)

## 2023-11-25 MED ORDER — SULFAMETHOXAZOLE-TRIMETHOPRIM 800-160 MG PO TABS: 1.0000 | ORAL_TABLET | Freq: Two times a day (BID) | ORAL | 0 refills | Status: AC

## 2023-11-25 NOTE — ED Provider Notes (Signed)
 [facility] Provider Note    Event Date/Time   First MD Initiated Contact with Patient 11/25/23 [phone]     (approximate)   History   Elbow Pain   HPI Jeffrey Sharp is a 28 y.o. male presenting today for left elbow pain and swelling.  He states over the past couple days he has noted some swelling to the outside of his left elbow.  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  Had read about bursitis and thought that maybe what was happening.  Denies any redness elsewhere.  No pain when moving his elbow or flexing/extending.  No fevers or chills.     Physical Exam   Triage Vital Signs: ED Triage Vitals  Encounter Vitals Group     BP 11/25/23 0318 (!) 118/94     Systolic BP Percentile --      Diastolic BP Percentile --      Pulse Rate 11/25/23 0318 95     Resp 11/25/23 0318 18     Temp 11/25/23 0318 98.6 F (37 C)     Temp Source 11/25/23 0318 Oral     SpO2 11/25/23 0318 99 %     Weight 11/25/23 0319 140 lb (63.5 kg)     Height 11/25/23 0319 5\' 7"  (1.702 m)     Head Circumference --      Peak Flow --      Pain Score --      Pain Loc --      Pain Education --      Exclude from Growth Chart --     Most recent vital signs: Vitals:   11/25/23 0318  BP: (!) 118/94  Pulse: 95  Resp: 18  Temp: 98.6 F (37 C)  SpO2: 99%   I have reviewed the vital signs. General:  Awake, alert, no acute distress. Head:  Normocephalic, Atraumatic. EENT:  PERRL, EOMI, Oral mucosa pink and moist, Neck is supple. Cardiovascular: Regular rate, 2+ distal pulses. Respiratory:  Normal respiratory effort, symmetrical expansion, no distress.   Extremities:  Moving all four extremities through full ROM without pain, specifically no pain with ROM of left elbow.  Slight swelling noted to the bursal sac without specific tenderness palpation.  Mild erythema present.  No other erythema elsewise in the left upper extremity. Neuro:  Alert and  oriented.  Interacting appropriately.   Skin:  Warm, dry, no rash.   Psych: Appropriate affect.    ED Results / Procedures / Treatments   Labs (all labs ordered are listed, but only abnormal results are displayed) Labs Reviewed  CBC WITH DIFFERENTIAL/PLATELET  BASIC METABOLIC PANEL WITH GFR  LACTIC ACID, PLASMA     EKG    RADIOLOGY    PROCEDURES:  Critical Care performed: No  Procedures   MEDICATIONS ORDERED IN ED: Medications - No data to display   IMPRESSION / MDM / ASSESSMENT AND PLAN / ED COURSE  I reviewed the triage vital signs and the nursing notes.                              Differential diagnosis includes, but is not limited to, bursitis, septic bursitis, cellulitis  Patient's presentation is most consistent with acute complicated illness / injury requiring diagnostic workup.  Patient is a 28 year old male presenting today for elbow pain and swelling on the left side.  His exam is consistent  with bursitis at the site.  No significant tenderness palpation and no systemic symptoms.  Laboratory workup with no indication of sepsis.  Given slight erythema, will start on short course of antibiotics and also recommended scheduled NSAID use to help decrease the swelling.  Told to follow-up with PCP and given strict return precautions for worsening symptoms.     FINAL CLINICAL IMPRESSION(S) / ED DIAGNOSES   Final diagnoses:  Bursitis of left elbow, unspecified bursa     Rx / DC Orders   ED Discharge Orders          Ordered    sulfamethoxazole-trimethoprim (BACTRIM DS) 800-160 MG tablet  2 times daily        11/25/23 0500             Note:  This document was prepared using Dragon voice recognition software and may include unintentional dictation errors.   Janith Lima, MD 11/25/23 [phone]

## 2023-11-25 NOTE — ED Triage Notes (Signed)
 Pt reports left elbow swelling and pain x2weeks. Pt denies known injury. Redness noted to elbow.

## 2023-11-25 NOTE — Discharge Instructions (Signed)
 Your laboratory workup was reassuring today with no signs of sepsis.  I suspect this is a bursitis which is inflammation of the bursal sac at your left elbow.  You can take ibuprofen 600 mg every 8 hours scheduled for the next 5 days.  Please take this with food.  I have also sent antibiotics to your pharmacy to take for the next 7 days.  Please return for any severe worsening symptoms.

## 2023-12-06 ENCOUNTER — Emergency Department (HOSPITAL_COMMUNITY)
Admission: EM | Admit: 2023-12-06 | Discharge: 2023-12-07 | Disposition: A | Discharge status: Other Acute Inpatient Hospital | Source: Home / Self Care | Attending: Emergency Medicine | Admitting: Emergency Medicine

## 2023-12-06 ENCOUNTER — Other Ambulatory Visit: Payer: Self-pay

## 2023-12-06 DIAGNOSIS — Z79899 Other long term (current) drug therapy: Secondary | ICD-10-CM | POA: Insufficient documentation

## 2023-12-06 DIAGNOSIS — F1910 Other psychoactive substance abuse, uncomplicated: Secondary | ICD-10-CM | POA: Insufficient documentation

## 2023-12-06 DIAGNOSIS — W19XXXA Unspecified fall, initial encounter: Secondary | ICD-10-CM | POA: Insufficient documentation

## 2023-12-06 DIAGNOSIS — F29 Unspecified psychosis not due to a substance or known physiological condition: Secondary | ICD-10-CM | POA: Insufficient documentation

## 2023-12-06 DIAGNOSIS — S0081XA Abrasion of other part of head, initial encounter: Secondary | ICD-10-CM | POA: Insufficient documentation

## 2023-12-06 NOTE — ED Triage Notes (Signed)
 Pt BIB [facility] from home per Family request. Per pts family - aunt - pt was pass out. Family went to get narcan concerned for OD. When they returned pt was alert. Pt wants him check out for drug use. Presents with injury to right side of his face.   Pt is alert. Pt endorses use of crack cocaine - wants to make sure it isnt laced. Denies SI/HI. Denies wanting resources stating he has already gone and it doesn't help.

## 2023-12-07 ENCOUNTER — Emergency Department (HOSPITAL_COMMUNITY)

## 2023-12-07 ENCOUNTER — Encounter (HOSPITAL_COMMUNITY): Payer: Self-pay | Admitting: Psychiatry

## 2023-12-07 ENCOUNTER — Inpatient Hospital Stay (HOSPITAL_COMMUNITY)
Admission: AD | Admit: 2023-12-07 | Discharge: 2023-12-11 | DRG: 897 | Disposition: A | Discharge status: Home / Self Care | Source: Intra-hospital | Attending: Psychiatry | Admitting: Psychiatry

## 2023-12-07 DIAGNOSIS — Z79899 Other long term (current) drug therapy: Secondary | ICD-10-CM

## 2023-12-07 DIAGNOSIS — F12188 Cannabis abuse with other cannabis-induced disorder: Secondary | ICD-10-CM | POA: Diagnosis present

## 2023-12-07 DIAGNOSIS — Z5941 Food insecurity: Secondary | ICD-10-CM

## 2023-12-07 DIAGNOSIS — F17290 Nicotine dependence, other tobacco product, uncomplicated: Secondary | ICD-10-CM | POA: Diagnosis present

## 2023-12-07 DIAGNOSIS — F1994 Other psychoactive substance use, unspecified with psychoactive substance-induced mood disorder: Principal | ICD-10-CM | POA: Insufficient documentation

## 2023-12-07 DIAGNOSIS — F1910 Other psychoactive substance abuse, uncomplicated: Secondary | ICD-10-CM | POA: Diagnosis not present

## 2023-12-07 DIAGNOSIS — F1414 Cocaine abuse with cocaine-induced mood disorder: Principal | ICD-10-CM | POA: Diagnosis present

## 2023-12-07 DIAGNOSIS — F909 Attention-deficit hyperactivity disorder, unspecified type: Secondary | ICD-10-CM | POA: Diagnosis present

## 2023-12-07 DIAGNOSIS — F17210 Nicotine dependence, cigarettes, uncomplicated: Secondary | ICD-10-CM | POA: Diagnosis present

## 2023-12-07 DIAGNOSIS — E559 Vitamin D deficiency, unspecified: Secondary | ICD-10-CM | POA: Diagnosis present

## 2023-12-07 DIAGNOSIS — Z5982 Transportation insecurity: Secondary | ICD-10-CM | POA: Diagnosis not present

## 2023-12-07 DIAGNOSIS — Z5900 Homelessness unspecified: Secondary | ICD-10-CM

## 2023-12-07 DIAGNOSIS — F3181 Bipolar II disorder: Secondary | ICD-10-CM | POA: Diagnosis not present

## 2023-12-07 DIAGNOSIS — F4010 Social phobia, unspecified: Secondary | ICD-10-CM | POA: Diagnosis present

## 2023-12-07 DIAGNOSIS — Z818 Family history of other mental and behavioral disorders: Secondary | ICD-10-CM | POA: Diagnosis not present

## 2023-12-07 LAB — RAPID URINE DRUG SCREEN, HOSP PERFORMED
Amphetamines: NOT DETECTED
Barbiturates: NOT DETECTED
Benzodiazepines: NOT DETECTED
Cocaine: POSITIVE — AB
Opiates: NOT DETECTED
Tetrahydrocannabinol: POSITIVE — AB

## 2023-12-07 LAB — COMPREHENSIVE METABOLIC PANEL WITH GFR
ALT: 20 U/L (ref 0–44)
AST: 16 U/L (ref 15–41)
Albumin: 4.6 g/dL (ref 3.5–5.0)
Alkaline Phosphatase: 44 U/L (ref 38–126)
Anion gap: 8 (ref 5–15)
BUN: 9 mg/dL (ref 6–20)
CO2: 28 mmol/L (ref 22–32)
Calcium: 9.7 mg/dL (ref 8.9–10.3)
Chloride: 100 mmol/L (ref 98–111)
Creatinine, Ser: 0.93 mg/dL (ref 0.61–1.24)
GFR, Estimated: >60 mL/min (ref >60)
Glucose, Bld: 86 mg/dL (ref 70–99)
Potassium: 3.6 mmol/L (ref 3.5–5.1)
Sodium: 136 mmol/L (ref 135–145)
Total Bilirubin: 0.7 mg/dL (ref 0.0–1.2)
Total Protein: 7.5 g/dL (ref 6.5–8.1)

## 2023-12-07 LAB — ETHANOL: Alcohol, Ethyl (B): <10 mg/dL (ref <10)

## 2023-12-07 LAB — CBC
HCT: 48.5 % (ref 39.0–52.0)
Hemoglobin: 15.8 g/dL (ref 13.0–17.0)
MCH: 27 pg (ref 26.0–34.0)
MCHC: 32.6 g/dL (ref 30.0–36.0)
MCV: 82.9 fL (ref 80.0–100.0)
Platelets: 259 10*3/uL (ref 150–400)
RBC: 5.85 MIL/uL — ABNORMAL HIGH (ref 4.22–5.81)
RDW: 12.9 % (ref 11.5–15.5)
WBC: 9.8 10*3/uL (ref 4.0–10.5)
nRBC: 0 % (ref 0.0–0.2)

## 2023-12-07 LAB — TSH: TSH: 4.891 u[IU]/mL — ABNORMAL HIGH (ref 0.350–4.500)

## 2023-12-07 LAB — ACETAMINOPHEN LEVEL: Acetaminophen (Tylenol), Serum: <10 ug/mL — ABNORMAL LOW (ref 10–30)

## 2023-12-07 LAB — CBG MONITORING, ED: Glucose-Capillary: 113 mg/dL — ABNORMAL HIGH (ref 70–99)

## 2023-12-07 LAB — SALICYLATE LEVEL: Salicylate Lvl: <7 mg/dL — ABNORMAL LOW (ref 7.0–30.0)

## 2023-12-07 MED ORDER — ENSURE ENLIVE PO LIQD
237.0000 mL | Freq: Two times a day (BID) | ORAL | Status: DC
  Administered 2023-12-09 – 2023-12-11 (×3): 237 mL via ORAL
  Filled 2023-12-07 (×12): qty 237

## 2023-12-07 MED ORDER — DIPHENHYDRAMINE HCL 25 MG PO CAPS: 50.0000 mg | ORAL_CAPSULE | Freq: Three times a day (TID) | ORAL | Status: DC | PRN

## 2023-12-07 MED ORDER — LORAZEPAM 2 MG/ML IJ SOLN: 2.0000 mg | Freq: Three times a day (TID) | INTRAMUSCULAR | Status: DC | PRN

## 2023-12-07 MED ORDER — NALTREXONE HCL 50 MG PO TABS
50.0000 mg | ORAL_TABLET | Freq: Every day | ORAL | Status: DC
  Filled 2023-12-07 (×3): qty 1

## 2023-12-07 MED ORDER — DIPHENHYDRAMINE HCL 50 MG/ML IJ SOLN: 50.0000 mg | Freq: Three times a day (TID) | INTRAMUSCULAR | Status: DC | PRN

## 2023-12-07 MED ORDER — HALOPERIDOL 5 MG PO TABS: 5.0000 mg | ORAL_TABLET | Freq: Three times a day (TID) | ORAL | Status: DC | PRN

## 2023-12-07 MED ORDER — ACETAMINOPHEN 325 MG PO TABS: 650.0000 mg | ORAL_TABLET | Freq: Four times a day (QID) | ORAL | Status: DC | PRN

## 2023-12-07 MED ORDER — NICOTINE POLACRILEX 2 MG MT GUM: 2.0000 mg | CHEWING_GUM | OROMUCOSAL | Status: DC | PRN

## 2023-12-07 MED ORDER — LITHIUM CARBONATE ER 300 MG PO TBCR
600.0000 mg | EXTENDED_RELEASE_TABLET | Freq: Every day | ORAL | Status: DC
  Filled 2023-12-07 (×3): qty 2

## 2023-12-07 MED ORDER — HALOPERIDOL LACTATE 5 MG/ML IJ SOLN: 10.0000 mg | Freq: Three times a day (TID) | INTRAMUSCULAR | Status: DC | PRN

## 2023-12-07 MED ORDER — MAGNESIUM HYDROXIDE 400 MG/5ML PO SUSP: 30.0000 mL | Freq: Every day | ORAL | Status: DC | PRN

## 2023-12-07 MED ORDER — ALUM & MAG HYDROXIDE-SIMETH 200-200-20 MG/5ML PO SUSP: 30.0000 mL | ORAL | Status: DC | PRN

## 2023-12-07 MED ORDER — LITHIUM CARBONATE ER 300 MG PO TBCR: 600.0000 mg | EXTENDED_RELEASE_TABLET | Freq: Every day | ORAL | Status: DC

## 2023-12-07 MED ORDER — HALOPERIDOL LACTATE 5 MG/ML IJ SOLN: 5.0000 mg | Freq: Three times a day (TID) | INTRAMUSCULAR | Status: DC | PRN

## 2023-12-07 MED ORDER — NALTREXONE HCL 50 MG PO TABS
50.0000 mg | ORAL_TABLET | Freq: Every day | ORAL | Status: DC
  Administered 2023-12-07: 50 mg via ORAL
  Filled 2023-12-07: qty 1

## 2023-12-07 NOTE — ED Notes (Signed)
 Pt's belonging (clothes, shoes, cell phone, hat) in staff belonging cabinet, next to 19-22 and [place] D cabinet

## 2023-12-07 NOTE — BH Assessment (Signed)
 Attempted to see patient again, but RN must still be tied up, no response to chat

## 2023-12-07 NOTE — ED Notes (Signed)
 Pt remains in triage room awaiting next available room

## 2023-12-07 NOTE — ED Notes (Signed)
 Pt transfer to room 36, now belonging in locker 36

## 2023-12-07 NOTE — Progress Notes (Signed)
 Patient admitted to unit, alert & oriented x 3, calm, dismissive and vague answering questions, states "I don't know why I am here, I thought I was getting checked out and go home after that." Patient denies SI, HI and AVH. After further speaking with patient he stated that he "passed out home and someone called the ambulance to make sure I was ok." Patient refused to elaborate as to why he passed out. All questions presented by patient answered. Patient oriented to unit, reviewed unit rules and schedule. Patient verbalized understanding. Patient is in his room in bed sitting quietly. Will continue monitoring patient.    12/07/23 1600  Psych Admission Type (Psych Patients Only)  Admission Status Involuntary  Psychosocial Assessment  Patient Complaints Sleep disturbance;Sadness;Restlessness;Substance abuse;Anxiety;Decreased concentration  Eye Contact Brief  Facial Expression Flat;Anxious  Affect Anxious;Sad  Speech Logical/coherent  Interaction Minimal  Motor Activity Other (Comment) (WNL)  Appearance/Hygiene Body odor;Disheveled;In scrubs  Behavior Characteristics Cooperative;Appropriate to situation  Mood Depressed;Anxious  Thought Process  Coherency WDL  Content Blaming others  Delusions None reported or observed  Perception WDL  Hallucination None reported or observed  Judgment Impaired  Confusion None  Danger to Self  Current suicidal ideation? Denies  Description of Suicide Plan no plan  Self-Injurious Behavior No self-injurious ideation or behavior indicators observed or expressed   Agreement Not to Harm Self Yes  Description of Agreement verbal  Danger to Others  Danger to Others None reported or observed

## 2023-12-07 NOTE — BH Assessment (Signed)
 Attempted to assess patient.  PN must have been tied up.  Waited 42 minutes for a response, had to move onto another patient to assess.

## 2023-12-07 NOTE — Plan of Care (Signed)

## 2023-12-07 NOTE — Consult Note (Signed)
 [facility] Psychiatric Consult Initial  Patient Name: .Zorian Gunderman  MRN: [id number]  DOB: 1996/04/08  Consult Order details:  Orders (From admission, onward)     Start     Ordered   12/07/23 0329  CONSULT TO CALL ACT TEAM       Ordering Provider: Tilden Fossa, MD  Provider:  (Not yet assigned)  Question:  Reason for Consult?  Answer:  Psych consult   12/07/23 0329             Mode of Visit: In person    Psychiatry Consult Evaluation  Service Date: December 07, 2023 LOS:  LOS: 0 days  Chief Complaint "I know why I'm here"  Primary Psychiatric Diagnoses  Polysubstance abuse   Assessment  Gabriell Lomeli is a 28 y.o. male admitted: Presented to the EDfor 12/06/2023 11:43 PM by EMS after family member found patient passed out. He carries the psychiatric diagnoses of none and has a past medical history of HSV infection.   His current presentation of not clearly remembering how he came to be in the emergency department combined with a UDS + for cocaine and THC is most consistent with polysubstance abuse. He meets criteria for inpatient drug abuse treatment based on using substances in spite of significant negative consequences to self.  Current outpatient psychotropic medications include none. On initial examination, patient is dismissive and vague. Bipolar disorder is on the differential after considering collateral information.  Please see plan below for detailed recommendations.   Diagnoses:  Active Hospital problems: Principal Problem:   Polysubstance abuse (HCC)    Plan   ## Psychiatric Medication Recommendations:  --Start lithium ER 600mg  PO Q HS  --Start naltrexone 50mg  PO Q day   ## Medical Decision Making Capacity: Not specifically addressed in this encounter  ## Further Work-up:  -- most recent EKG on 12/07/2023 had QtC of 416 -- Pertinent labwork reviewed earlier this admission includes: CBC, CMP, acetaminophen, salicylate, alcohol and UDS   ##  Disposition:-- We recommend inpatient psychiatric hospitalization after medical hospitalization. Patient has been involuntarily committed on 12/06/2023.   ## Behavioral / Environmental: - No specific recommendations at this time.     ## Safety and Observation Level:  - Based on my clinical evaluation, I estimate the patient to be at low risk of self harm in the current setting. - At this time, we recommend  routine. This decision is based on my review of the chart including patient's history and current presentation, interview of the patient, mental status examination, and consideration of suicide risk including evaluating suicidal ideation, plan, intent, suicidal or self-harm behaviors, risk factors, and protective factors. This judgment is based on our ability to directly address suicide risk, implement suicide prevention strategies, and develop a safety plan while the patient is in the clinical setting. Please contact our team if there is a concern that risk level has changed.  CSSR Risk Category:C-SSRS RISK CATEGORY: No Risk  Suicide Risk Assessment: Patient has following modifiable risk factors for suicide: lack of access to outpatient mental health resources, which we are addressing by recommending inpatient treatment. Patient has following non-modifiable or demographic risk factors for suicide: male gender Patient has the following protective factors against suicide: Supportive family  Thank you for this consult request. Recommendations have been communicated to the primary team.  We will continue to follow at this time.   Thomes Lolling, NP       History of Present Illness  Relevant Aspects of [facility]  ED Course:  Admitted on 12/06/2023 for by EMS after family member found patient passed out. He carries the psychiatric diagnoses of none and has a past medical history of HSV infection.   Patient Report:   [place], is seen face to face by this provider, consulted with Dr. Deborah Falling; and  chart reviewed on 12/07/23.  On evaluation Sloan Tarpley is found asleep, difficult to arouse and with blood dripping down his face.  Upon inspection, patient's cheek wound is open and bleeding.  Staff nurse cleans patient's cheek wound and applies a clean bandage.  Patient states "I hit my cheek on stairs."  Patient states he can't stop using substances; he says "there is no point".  He says he has been staying with his aunt, but that he is homeless.  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  Patient says he has a 46 year old son and makes reference to a potential other child, but refuses to elaborate stating "that's sensitive".  He denies a current romantic relationship.    Per affidavit and petition "respondent stated to petitioner that he does not want to live anymore and that he can't take it, he wants to end it and not be here anymore.  Respondent used cocaine and THC per petitioner. Respondent is hearing voices and does not make sense at times.  Respondent is having erratic behavior and mood swings.  Respondent told petitioner he hasn't slept in days and is doing any drug he can get his hands on. Showed up at petitioner's sister's house under influence of unknown drug and passed out and hit his head."  During evaluation Colvin Sessa is laying in bed in no acute distress.  He is alert & oriented x 3, calm, dismissive and vague during this assessment.  His mood appears hopeless with congruent affect.  He has normal speech.  Objectively there is no evidence of psychosis/mania or delusional thinking. Pt does not appear to be responding to internal or external stimuli.  Patient is able to converse coherently.  He refuses to answer regarding thoughts of suicidal/self-harm/homicidal ideation, psychosis, and paranoia.  Patient was not completely forthcoming when answering questions.  Psych ROS:  Depression: endorses Anxiety:  UTA Mania (lifetime and current):  UTA Psychosis: (lifetime and current): denies  Collateral information:  Contacted Delayne Feather, mother, at [phone] on 12/07/2023.  Mom says patient checked himself into [facility] for a month "a while back".  She says he told her he left because he needed his wisdom teeth out.  He is the only boy out of a family of 6 children.  He was raised by his mother and step father; his bio father has nothing to do with him.  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  She says the patient had a therapist in middle school who reported that the patient had expressed suicidal ideation at that time.  Mom says that she and patient's sister have both suffered from depression and that his sister was hospitalized after a suicide attempt last November.  She says the patient has had several episodes where he will not sleep for days at a time.  She thinks it might be because of drugs, but she isn't sure.  She denies any family history of mood disorders or psychosis.  Mom states patient's substance use has increased dramatically and the patient has lost a lot  of weight.  She is concerned he will die.  She says she told her son "please don't make me plan your funeral".   Mom is provided with information about Al-Anon.  Review of Systems  Psychiatric/Behavioral:  Positive for substance abuse.   All other systems reviewed and are negative.    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 Hx: denies Living  Situation: homeless and staying with aunt Spiritual Hx: none Access to weapons/lethal means: denies   Substance History Alcohol: denies  Type of alcohol denies Last Drink denies Number of drinks per day denies History of alcohol withdrawal seizures denies History of DT's denies Tobacco: UTA Illicit drugs: UDS + for cocaine and THC Prescription drug abuse: UTA Rehab hx: denies  Exam Findings  Physical Exam:  Vital Signs:  Temp:  [97.7 F (36.5 C)-97.8 F (36.6 C)] 97.7 F (36.5 C) (04/16 1007) Pulse Rate:  [57-80] 57 (04/16 1007) Resp:  [15-18] 18 (04/16 1007) BP: (109-132)/(74-87) 114/74 (04/16 1007) SpO2:  [100 %] 100 % (04/16 1007) Blood pressure 114/74, pulse (!) 57, temperature 97.7 F (36.5 C), temperature source Oral, resp. rate 18, SpO2 100%. There is no height or weight on file to calculate BMI.  Physical Exam Vitals and nursing note reviewed.  Constitutional:      Comments: Appears underweight  Eyes:     Pupils: Pupils are equal, round, and reactive to light.  Pulmonary:     Effort: Pulmonary effort is normal.  Skin:    General: Skin is dry.     Comments: Wound covered by bandage on right cheek  Neurological:     Mental Status: He is alert and oriented to person, place, and time.  Psychiatric:        Mood and Affect: Affect is flat.        Speech: Speech normal.        Behavior: Behavior is uncooperative and withdrawn.        Cognition and Memory: Memory is impaired.     Mental Status Exam: General Appearance: Disheveled  Orientation:  Full (Time, Place, and Person)  Memory:  Immediate;   Poor  Concentration:  Concentration: Poor  Recall:  Poor  Attention  Poor  Eye Contact:  Minimal  Speech:  Clear and Coherent  Language:  Fair  Volume:  Normal  Mood: hopeless  Affect:  Congruent  Thought Process:  UTA  Thought Content:  UTA  Suicidal Thoughts:   UTA  Homicidal Thoughts:   UTA  Judgement:  Impaired  Insight:  Lacking  Psychomotor  Activity:  Normal  Akathisia:  No  Fund of Knowledge:  Fair      Assets:  Physical Health Social Support  Cognition:  WNL  ADL's:  Intact  AIMS (if indicated):        Other History   These have been pulled in through the EMR, reviewed, and updated if appropriate.  Family History:  The patient's family history is not on file.  Medical History: No past medical history on file.  Surgical History: No past surgical history on file.   Medications:  No current facility-administered medications for this encounter. No current outpatient medications on file.  Allergies: No Known Allergies  Tristan Bramble A Davide Risdon, NP

## 2023-12-07 NOTE — ED Provider Notes (Signed)
 [place] EMERGENCY DEPARTMENT AT [facility] Provider Note   CSN: [id number] Arrival date & time: 12/06/23  2339     History  Chief Complaint  Patient presents with   Drug Problem    Jeffrey Sharp is a 28 y.o. male.  The history is provided by the patient.  Drug Problem  Jeffrey Sharp is a 27 y.o. male who presents to the Emergency Department complaining of drug problem.  Patient presents to the emergency department by EMS upon request from the family due to patient having a drug problem.  He was found by family member passed out and when family went to get Narcan that he was awake and alert.  They are concerned about his drug use.  Patient states he is unsure why he is here, unsure of where he is.  He does report passing out but cannot provide any additional details.  He does report that he has an illusion of space and fell.  He wants hospital stuff and so he can eat.  He does vape.  He does not use alcohol.  He does use crack as well as marijuana by smoking.  He denies any SI. Home Medications Prior to Admission medications   Not on File      Allergies    Patient has no known allergies.    Review of Systems   Review of Systems  All other systems reviewed and are negative.   Physical Exam Updated Vital Signs BP 113/74   Pulse 80   Temp 97.8 F (36.6 C)   Resp 18   SpO2 100%  Physical Exam Vitals and nursing note reviewed.  Constitutional:      Appearance: He is well-developed.  HENT:     Head: Normocephalic.     Comments: Abrasion to right check without significant swelling.  Cardiovascular:     Rate and Rhythm: Normal rate and regular rhythm.     Heart sounds: No murmur heard. Pulmonary:     Effort: Pulmonary effort is normal. No respiratory distress.     Breath sounds: Normal breath sounds.  Abdominal:     Palpations: Abdomen is soft.     Tenderness: There is no abdominal tenderness. There is no guarding or rebound.  Musculoskeletal:         General: No tenderness.  Skin:    General: Skin is warm and dry.  Neurological:     Mental Status: He is alert.     Comments: Oriented to person.  Disoriented to time, place and recent events.  5 out of 5 strength in all 4 extremities.  Psychiatric:     Comments: Tangential     ED Results / Procedures / Treatments   Labs (all labs ordered are listed, but only abnormal results are displayed) Labs Reviewed  CBC - Abnormal; Notable for the following components:      Result Value   RBC 5.85 (*)    All other components within normal limits  RAPID URINE DRUG SCREEN, HOSP PERFORMED - Abnormal; Notable for the following components:   Cocaine POSITIVE (*)    Tetrahydrocannabinol POSITIVE (*)    All other components within normal limits  ACETAMINOPHEN LEVEL - Abnormal; Notable for the following components:   Acetaminophen (Tylenol), Serum <10 (*)    All other components within normal limits  SALICYLATE LEVEL - Abnormal; Notable for the following components:   Salicylate Lvl <7.0 (*)    All other components within normal limits  CBG MONITORING, ED -  Abnormal; Notable for the following components:   Glucose-Capillary 113 (*)    All other components within normal limits  COMPREHENSIVE METABOLIC PANEL WITH GFR  ETHANOL    EKG EKG Interpretation Date/Time:  Wednesday December 07 2023 02:46:19 EDT Ventricular Rate:  70 PR Interval:  147 QRS Duration:  86 QT Interval:  385 QTC Calculation: 416 R Axis:   84  Text Interpretation: Sinus arrhythmia Consider left atrial enlargement RSR' in V1 or V2, probably normal variant Confirmed by Kelsey Patricia [phone]) on 12/07/2023 2:51:43 AM  Radiology CT Head Wo Contrast Result Date: 12/07/2023 CLINICAL DATA:  Head trauma, abnormal mental status (Age 44-64y). Syncope. EXAM: CT HEAD WITHOUT CONTRAST TECHNIQUE: Contiguous axial images were obtained from the base of the skull through the vertex without intravenous contrast. RADIATION DOSE REDUCTION:  This exam was performed according to the departmental dose-optimization program which includes automated exposure control, adjustment of the mA and/or kV according to patient size and/or use of iterative reconstruction technique. COMPARISON:  02/01/2015 FINDINGS: Brain: No acute intracranial abnormality. Specifically, no hemorrhage, hydrocephalus, mass lesion, acute infarction, or significant intracranial injury. Vascular: No hyperdense vessel or unexpected calcification. Skull: No acute calvarial abnormality. Sinuses/Orbits: No acute findings Other: None IMPRESSION: Normal study. Electronically Signed   By: Janeece Mechanic M.D.   On: 12/07/2023 01:34    Procedures Procedures    Medications Ordered in ED Medications - No data to display  ED Course/ Medical Decision Making/ A&P                                 Medical Decision Making Amount and/or Complexity of Data Reviewed Labs: ordered. Radiology: ordered.   Patient brought in by EMS, IVC completed by family shortly after patient ED arrival.  Per IVC the patient had told petitioner that he did not want to live anymore and that he cannot take it.  IVC also reports that he is hearing voices and does not make sense at times.  He also has erratic behavior and mood swings.  On evaluation patient is drowsy, confused.  He does have a tiny abrasion to his cheek.  Images are negative for acute abnormality.  He does appear intoxicated on examination.  Current picture is not consistent with CVA, sepsis.  First examination completed.  Feel patient will benefit from ongoing psychiatric care.        Final Clinical Impression(s) / ED Diagnoses Final diagnoses:  Substance abuse [facility])    Rx / DC Orders ED Discharge Orders     None         Kelsey Patricia, MD 12/07/23 [phone]

## 2023-12-07 NOTE — Progress Notes (Signed)
 Pt has been accepted to [facility] on 12/07/23   Bed assignment: 303-2  Pt meets inpatient criteria per: Lady Pier NP  Attending Physician: Linnie Riches MD  Report can be called to: [phone]  Pt can arrive ASAP  Care Team Notified: Lady Pier NP, Laverne J. Holley RN  Patt Boozer, [facility]  12/07/23

## 2023-12-08 DIAGNOSIS — F3181 Bipolar II disorder: Secondary | ICD-10-CM | POA: Diagnosis not present

## 2023-12-08 LAB — LIPID PANEL
Cholesterol: 155 mg/dL (ref 0–200)
HDL: 53 mg/dL (ref >40)
LDL Cholesterol: 64 mg/dL (ref 0–99)
Total CHOL/HDL Ratio: 2.9 ratio
Triglycerides: 189 mg/dL — ABNORMAL HIGH (ref <150)
VLDL: 38 mg/dL (ref 0–40)

## 2023-12-08 LAB — HIV ANTIBODY (ROUTINE TESTING W REFLEX): HIV Screen 4th Generation wRfx: NONREACTIVE

## 2023-12-08 LAB — FOLATE: Folate: 9.7 ng/mL (ref >5.9)

## 2023-12-08 LAB — VITAMIN B12: Vitamin B-12: 216 pg/mL (ref 180–914)

## 2023-12-08 LAB — VITAMIN D 25 HYDROXY (VIT D DEFICIENCY, FRACTURES): Vit D, 25-Hydroxy: 14.16 ng/mL — ABNORMAL LOW (ref 30–100)

## 2023-12-08 MED ORDER — QUETIAPINE FUMARATE ER 50 MG PO TB24
100.0000 mg | ORAL_TABLET | Freq: Every day | ORAL | Status: DC
  Administered 2023-12-08: 100 mg via ORAL
  Filled 2023-12-08 (×3): qty 2

## 2023-12-08 NOTE — Plan of Care (Signed)
  Problem: Education: Goal: Knowledge of [place] [facility] Education information/materials will improve Outcome: Progressing Goal: Emotional status will improve Outcome: Progressing Goal: Mental status will improve Outcome: Progressing; attended group Goal: Verbalization of understanding the information provided will improve Outcome: Progressing   Problem: Safety: Goal: Periods of time without injury will increase Outcome: Progressing

## 2023-12-08 NOTE — Group Note (Signed)
 LCSW Group Therapy Note   Group Date: 12/08/2023 Start Time: 1100 End Time: 1200  Participation:  did not attend  Type of Therapy:  Group Therapy  Title: Lifestyle:  from "One Day" to "Today is Day One"  Objective:  To promote mental and physical well-being through lifestyle changes in routine, nutrition, sleep, and movement.  Goals: Increase awareness of how lifestyle habits impact mental health. Encourage one small, achievable wellness goal. Support group sharing and accountability.  Summary:  Group members explored how daily habits influence mental health and discussed the importance of starting with small, manageable changes. Participants identified personal goals and shared reflections on improving structure, sleep, diet, and physical activity.  Therapeutic Modalities: CBT - Identifying and challenging all-or-nothing thinking; promoting realistic, helpful thoughts about change. Psychoeducation - Teaching about the impact of sleep, nutrition, movement, and routine on mental health. Motivational Interviewing - Eliciting personal motivation and exploring readiness for change. Goal-Setting - Supporting SMART goals to build self-efficacy and encourage follow-through.   Ioan Landini O Brittaney Beaulieu, LCSWA 12/08/2023  12:13 PM

## 2023-12-08 NOTE — Plan of Care (Signed)
   Problem: Education: Goal: Emotional status will improve Outcome: Progressing Goal: Mental status will improve Outcome: Progressing

## 2023-12-08 NOTE — [facility] Suicide Risk Assessment (Signed)
 Suicide Risk Assessment  Admission Assessment    [facility] Admission Suicide Risk Assessment   Nursing information obtained from:  Patient Demographic factors:  Male, Caucasian, Unemployed, Low socioeconomic status Current Mental Status:  Suicidal ideation indicated by others Loss Factors:  Financial problems / change in socioeconomic status Historical Factors:  Impulsivity Risk Reduction Factors:  NA  Total Time spent with patient: 30 minutes Principal Problem: Bipolar 2 disorder (HCC) Diagnosis:  Principal Problem:   Bipolar 2 disorder (HCC)  Subjective Data: Jeffrey Sharp, a 28 year old male presented to [facility] emergency department on December 06, 2023, with concerns related to substance use.  He was brought in via EMS at the request of family members, who reported finding him unresponsive.  Upon attempting to administer Narcan, the patient was reportedly observed to be awake and alert, but his family remain concerned about ongoing drug use.  The patient reportedly admitted to recent use of crack cocaine and marijuana.  He does not have a documented psychiatric history but has a medical history notable for HSV infection.  He was subsequently admitted to the [facility] on the same day for further stabilization and treatment.  Continued Clinical Symptoms:  Alcohol Use Disorder Identification Test Final Score (AUDIT): 1 The "Alcohol Use Disorders Identification Test", Guidelines for Use in Primary Care, Second Edition.  World Science writer [facility]). Score between 0-7:  no or low risk or alcohol related problems. Score between 8-15:  moderate risk of alcohol related problems. Score between 16-19:  high risk of alcohol related problems. Score 20 or above:  warrants further diagnostic evaluation for alcohol dependence and treatment.   CLINICAL FACTORS:   Alcohol/Substance Abuse/Dependencies   Musculoskeletal: Strength & Muscle Tone: within normal limits Gait & Station:  normal Patient leans: N/A  Psychiatric Specialty Exam:  Presentation  General Appearance: Appropriate for Environment  Eye Contact:Good  Speech:Clear and Coherent; Normal Rate  Speech Volume:Normal  Handedness:No data recorded  Mood and Affect  Mood:Euthymic  Affect:Congruent   Thought Process  Thought Processes:Coherent  Descriptions of Associations:Intact  Orientation:Full (Time, Place and Person)  Thought Content:Logical  History of Schizophrenia/Schizoaffective disorder:No data recorded Duration of Psychotic Symptoms:No data recorded Hallucinations:Hallucinations: None  Ideas of Reference:None  Suicidal Thoughts:Suicidal Thoughts: No  Homicidal Thoughts:Homicidal Thoughts: No   Sensorium  Memory:Immediate Fair  Judgment:Poor  Insight:Poor   Executive Functions  Concentration:Fair  Attention Span:Fair  Recall:Fair  Fund of Knowledge:Fair  Language:Fair   Psychomotor Activity  Psychomotor Activity:Psychomotor Activity: Restlessness   Assets  Assets:Physical Health   Sleep  Sleep:Sleep: Fair    Physical Exam: Physical Exam See H&P  ROS See H&P  Blood pressure 117/72, pulse 78, temperature 99 F (37.2 C), temperature source Oral, resp. rate 18, height 5\' 6"  (1.676 m), weight 51.7 kg, SpO2 98%. Body mass index is 18.4 kg/m.   COGNITIVE FEATURES THAT CONTRIBUTE TO RISK:  Closed-mindedness    SUICIDE RISK:   Mild:  Suicidal ideation of limited frequency, intensity, duration, and specificity.  There are no identifiable plans, no associated intent, mild dysphoria and related symptoms, good self-control (both objective and subjective assessment), few other risk factors, and identifiable protective factors, including available and accessible social support.  PLAN OF CARE: See H&P   I certify that inpatient services furnished can reasonably be expected to improve the patient's condition.   Norma Fredrickson, NP 12/08/2023,  5:33 PM

## 2023-12-08 NOTE — Progress Notes (Addendum)
 Patient denies SI/HI/AVH this morning. Pt rates his depression a 0/10 and anxiety a 0/10. Pt reports that he slept "well" last night. Pt denied morning dose of naltrexone, reporting that he does not have a problem with alcohol or drug use. Pt's thought process presents as tangential.  Pt has been calm and cooperative throughout the day.  Q 15 minute safety checks are in place for patient's safety. Patient is currently safe on the unit.   12/08/23 0900  Psych Admission Type (Psych Patients Only)  Admission Status Involuntary  Psychosocial Assessment  Patient Complaints Anxiety;Depression  Eye Contact Fair  Facial Expression Animated  Affect Appropriate to circumstance  Speech Logical/coherent  Interaction Assertive  Motor Activity Restless  Appearance/Hygiene Disheveled  Behavior Characteristics Cooperative  Mood Anxious;Pleasant  Thought Process  Coherency Tangential  Content WDL  Delusions None reported or observed  Perception WDL  Hallucination None reported or observed  Judgment Impaired  Confusion None  Danger to Self  Current suicidal ideation? Denies  Description of Suicide Plan n/a  Self-Injurious Behavior No self-injurious ideation or behavior indicators observed or expressed   Agreement Not to Harm Self Yes  Description of Agreement verbal  Danger to Others  Danger to Others None reported or observed

## 2023-12-08 NOTE — H&P (Signed)
 Psychiatric Admission Assessment Adult  Patient Identification: Jeffrey Sharp MRN:  [id number] Date of Evaluation:  12/09/2023 Chief Complaint:  Bipolar 2 disorder (HCC) [F31.81] Principal Diagnosis: Substance induced mood disorder (HCC) Diagnosis:  Principal Problem:   Substance induced mood disorder (HCC)  History of Present Illness: Jeffrey Sharp, a 28 year old male [facility], [facility], with concerns related to substance use.  He was brought in via EMS at the request of family members, who reported finding him unresponsive.  Upon attempting to administer Narcan, the patient was reportedly observed to be awake and alert, but his family remain concerned about ongoing drug use.  The patient reportedly admitted to recent use of crack cocaine and marijuana.  He does not have a documented psychiatric history but has a medical history notable for HSV infection.  He was subsequently admitted to the [facility] on the same day for further stabilization and treatment.  Chart review - Per affidavit and petition "respondent stated to petitioner that he does not want to live anymore and that he can't take it, he wants to end it and not be here anymore. Respondent used cocaine and THC per petitioner. Respondent is hearing voices and does not make sense at times. Respondent is having erratic behavior and mood swings. Respondent told petitioner he hasn't slept in days and is doing any drug he can get his hands on. Showed up at petitioner's sister's house under influence of unknown drug and passed out and hit his head."    On today's evaluation, the patient reports substance use and associated psychiatric symptoms. He reports daily use of crack cocaine for the past two months and regular marijuana use. He expresses a desire for help to improve his situation. He denies suicidal ideation, self-harm urges, and a history of "serious" suicide attempts, although  he reports a past incident where he threatened to harm himself with a knife, which led to a visit to the emergency department. No psychiatric hospitalizations were reported.  The patient reports a past diagnosis of ADHD and a history of taking Vyvanse, which he discontinued at the age of 12. Reports he has not engaged in psychiatric care or therapy previously. He identifies substance use as his primary stressor and is currently not prescribed any medications. He denies symptoms of depression but reported manic symptoms, including distractibility, elevated mood, impulsivity, and hallucinations, particularly under the influence of substances. He describes psychotic symptoms such as auditory and visual hallucinations and paranoia when using substances, including hearing voices and seeing figures such as "the Hat Man." He also reports social anxiety and poor sleep patterns associated with substance use, including staying awake for extended periods followed by crashing.  The patient denies symptoms of PTSD, OCD, eating disorders, and a history of trauma or abuse, although he reports experiencing bullying in school. He endorses borderline personality traits, including fear of abandonment, unstable relationships, and chronic feelings of emptiness. He reports paranoia, believing that others are "out to get him."  Regarding substance use history, the patient reports previous use of heroin (snorted), methamphetamine, and mushrooms, and denies alcohol use. Reports he vapes nicotine  daily. He resides in [place] with a friend and reports potential living arrangements with family members if he becomes clean. Reports his highest level of education is high school with some college coursework in plumbing, and he is self-employed in IT consultant jobs. He identifies his support system as his mother, immediate family, and older sister, and reports hobbies such as playing  music and building things. He identifies as  straight and male.  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 He denies legal problems and reports a family history of depression in his sister but no completed suicides or substance abuse in the family.  The case was discussed with the attending psychiatrist, and it was decided to discontinue lithium  and naltrexone  due to the patient's persistent refusal. Initiation of Seroquel  at 100 mg daily at bedtime was planned.  Collateral obtained in the ED by K. Weber, NP Contacted Delayne Feather, mother, at [phone] on 12/07/2023.  Mom says patient checked himself into [facility] for a [facility] "a while back".  She says he told her he left because he needed his wisdom teeth out.  He is the only boy out of a family of 6 children.  He was raised by his mother and step father; his bio father has nothing to do with him.  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  She says the patient had a therapist in middle school who reported that the patient had expressed suicidal ideation at that time.  Mom says that she and patient's sister have both suffered from depression and that his sister was hospitalized after a suicide attempt last November.  She says the patient has had several episodes where he will not sleep for days at a time.  She thinks it might be because of drugs, but she isn't sure.  She denies any family history of mood disorders or psychosis.  Mom states patient's substance use has increased dramatically and the patient has lost a lot of weight.  She is concerned he will die.  She says she told her son "please don't make me plan your funeral".   Mom is provided with information about Al-Anon.  Associated Signs/Symptoms: Depression Symptoms:  Denies  (Hypo) Manic Symptoms:  Distractibility, Elevated  Mood, Hallucinations, Impulsivity, Anxiety Symptoms:  Social Anxiety, Psychotic Symptoms:  Hallucinations: Auditory Visual Paranoia, PTSD Symptoms: Denies  Total Time spent with patient: 1 hour  Past Psychiatric History:  - Past psychiatric diagnoses: ADHD. - Past medication trial: Vyvanse, stopped at age 8. - No history of seeing a psychiatrist or engaging in therapy. - No psychiatric hospitalizations. - Emergency department visit after threatening self-harm with a knife. - No history of self-harm or suicide attempts, but threatened self-harm in the past (>10 years ago).  - No history of trauma or abuse, but reports bullying in school. - Reports manic symptoms under influence of substances: distractibility, elevated mood, staying up for two or three nights at a time, impulsivity. - Reports psychotic symptoms under influence of substances: auditory and visual hallucinations, paranoia. - Reports borderline personality traits: fear of abandonment and rejection, unstable relationships, chronic emptiness. - Reports social anxiety.  Is the patient at risk to self? No.  Has the patient been a risk to self in the past 6 months? No.  Has the patient been a risk to self within the distant past? Yes.    Is the patient a risk to others? No.  Has the patient been a risk to others in the past 6 months? No.  Has the patient been a risk to others within the distant past? No.   [place] Scale:  Flowsheet Row [facility]) [facility] from 12/06/2023 in [facility] Department at [facility] ED from 11/25/2023 in [facility] Emergency Department at [facility]  C-SSRS RISK CATEGORY No Risk No Risk No Risk        Prior Inpatient Therapy: No. Denies  Prior Outpatient Therapy: No. Denies    Alcohol Screening: 1. How often do you have a drink containing alcohol?: Monthly or less 2. How many drinks containing  alcohol do you have on a typical day when you are drinking?: 1 or 2 3. How often do you have six or more drinks on one occasion?: Never AUDIT-C Score: 1 4. How often during the last year have you found that you were not able to stop drinking once you had started?: Never 5. How often during the last year have you failed to do what was normally expected from you because of drinking?: Never 6. How often during the last year have you needed a first drink in the morning to get yourself going after a heavy drinking session?: Never 7. How often during the last year have you had a feeling of guilt of remorse after drinking?: Never 8. How often during the last year have you been unable to remember what happened the night before because you had been drinking?: Never 9. Have you or someone else been injured as a result of your drinking?: No 10. Has a relative or friend or a doctor or another health worker been concerned about your drinking or suggested you cut down?: No Alcohol Use Disorder Identification Test Final Score (AUDIT): 1 Alcohol Brief Interventions/Follow-up: Alcohol education/Brief advice Substance Abuse History in the last 12 months:  Yes.   Consequences of Substance Abuse: Negative Previous Psychotropic Medications: No  Psychological Evaluations: No  Past Medical History: History reviewed. No pertinent past medical history. History reviewed. No pertinent surgical history. Family History: History reviewed. No pertinent family history. Family Psychiatric  History: The patient reports a family history of depression in his sister. He denies any completed suicides or substance abuse in the family.  Tobacco Screening:  Social History   Tobacco Use  Smoking Status Every Day   Current packs/day: 0.50   Types: Cigarettes  Smokeless Tobacco Never    BH Tobacco Counseling     Are you interested in Tobacco Cessation Medications?  No value filed. Counseled patient on smoking cessation:  No  value filed. Reason Tobacco Screening Not Completed: No value filed.       Social History:  Social History   Substance and Sexual Activity  Alcohol Use Yes     Social History   Substance and Sexual Activity  Drug Use No    Additional Social History:                           Allergies:  No Known Allergies Lab Results:  Results for orders placed or performed during the hospital encounter of 12/07/23 (from the past 48 hours)  Folate     Status: None   Collection Time: 12/08/23  6:29 PM  Result Value Ref Range   Folate 9.7 >5.9 ng/mL    Comment: Performed at [facility], [address] [place], [place] [address]  Vitamin B12     Status: None   Collection Time: 12/08/23  6:29 PM  Result Value Ref Range   Vitamin B-12 216 180 - 914 pg/mL    Comment: (NOTE) This assay is not validated for testing  neonatal or myeloproliferative syndrome specimens for Vitamin B12 levels. Performed at [facility], [address], [place], [place] [address]   VITAMIN D  25 Hydroxy (Vit-D Deficiency, Fractures)     Status: Abnormal   Collection Time: 12/08/23  6:29 PM  Result Value Ref Range   Vit D, 25-Hydroxy 14.16 (L) 30 - 100 ng/mL    Comment: (NOTE) Vitamin D  deficiency has been defined by the Institute of Medicine  and an Endocrine Society practice guideline as a level of serum 25-OH  vitamin D  less than 20 ng/mL (1,2). The Endocrine Society went on to  further define vitamin D  insufficiency as a level between 21 and 29  ng/mL (2).  1. IOM (Institute of Medicine). 2010. Dietary reference intakes for  calcium and D. [place]: [employer]. 2. Holick MF, Binkley Farson, Bischoff-Ferrari HA, et al. Evaluation,  treatment, and prevention of vitamin D  deficiency: an Endocrine  Society clinical practice guideline, JCEM. 2011 Jul; 96(7): 1911-30.  Performed at [facility], [address] [place],  [place] [address]   Hemoglobin A1c     Status: None   Collection Time: 12/08/23  6:29 PM  Result Value Ref Range   Hgb A1c MFr Bld 5.3 4.8 - 5.6 %    Comment: (NOTE)         Prediabetes: 5.7 - 6.4         Diabetes: >6.4         Glycemic control for adults with diabetes: <7.0    Mean Plasma Glucose 105 mg/dL    Comment: (NOTE) Performed At: [facility] [address] [place], [place] [id number] Pearlean Botts MD WG:[id number]   Lipid panel     Status: Abnormal   Collection Time: 12/08/23  6:29 PM  Result Value Ref Range   Cholesterol 155 0 - 200 mg/dL   Triglycerides 784 (H) <150 mg/dL   HDL 53 >69 mg/dL   Total CHOL/HDL Ratio 2.9 RATIO   VLDL 38 0 - 40 mg/dL   LDL Cholesterol 64 0 - 99 mg/dL    Comment:        Total Cholesterol/HDL:CHD Risk Coronary Heart Disease Risk Table                     Men   Women  1/2 Average Risk   3.4   3.3  Average Risk       5.0   4.4  2 X Average Risk   9.6   7.1  3 X Average Risk  23.4   11.0        Use the calculated Patient Ratio above and the CHD Risk Table to determine the patient's CHD Risk.        ATP III CLASSIFICATION (LDL):  <100     mg/dL   Optimal  629-528  mg/dL   Near or Above                    Optimal  130-159  mg/dL   Borderline  413-244  mg/dL   High  >010     mg/dL   Very High Performed at [facility], [address], [place], [place] [address]   T4, free     Status: None   Collection Time: 12/08/23  6:29 PM  Result Value Ref Range   Free T4 0.74 0.61 - 1.12 ng/dL    Comment: (NOTE) Biotin ingestion may interfere with free  T4 tests. If the results are inconsistent with the TSH level, previous test results, or the clinical presentation, then consider biotin interference. If needed, order repeat testing after stopping biotin. Performed at [facility], [address], [place], [place] [address]   HIV Antibody (routine testing w rflx)     Status: None   Collection Time: 12/08/23   6:29 PM  Result Value Ref Range   HIV Screen 4th Generation wRfx Non Reactive Non Reactive    Comment: Performed at [facility], [address], [place], [place] [address]    Blood Alcohol level:  Lab Results  Component Value Date   ETH <10 12/07/2023    Metabolic Disorder Labs:  Lab Results  Component Value Date   HGBA1C 5.3 12/08/2023   MPG 105 12/08/2023   No results found for: "PROLACTIN" Lab Results  Component Value Date   CHOL 155 12/08/2023   TRIG 189 (H) 12/08/2023   HDL 53 12/08/2023   CHOLHDL 2.9 12/08/2023   VLDL 38 12/08/2023   LDLCALC 64 12/08/2023    Current Medications: Current Facility-Administered Medications  Medication Dose Route Frequency Provider Last Rate Last Admin   acetaminophen  (TYLENOL ) tablet 650 mg  650 mg Oral Q6H PRN Weber, Kyra A, NP       alum & mag hydroxide-simeth (MAALOX/MYLANTA) 200-200-20 MG/5ML suspension 30 mL  30 mL Oral Q4H PRN Weber, Kyra A, NP       haloperidol  (HALDOL ) tablet 5 mg  5 mg Oral TID PRN Weber, Kyra A, NP       And   diphenhydrAMINE  (BENADRYL ) capsule 50 mg  50 mg Oral TID PRN Weber, Kyra A, NP       haloperidol  lactate (HALDOL ) injection 5 mg  5 mg Intramuscular TID PRN Weber, Kyra A, NP       And   diphenhydrAMINE  (BENADRYL ) injection 50 mg  50 mg Intramuscular TID PRN Weber, Kyra A, NP       And   LORazepam  (ATIVAN ) injection 2 mg  2 mg Intramuscular TID PRN Weber, Kyra A, NP       haloperidol  lactate (HALDOL ) injection 10 mg  10 mg Intramuscular TID PRN Weber, Kyra A, NP       And   diphenhydrAMINE  (BENADRYL ) injection 50 mg  50 mg Intramuscular TID PRN Weber, Kyra A, NP       And   LORazepam  (ATIVAN ) injection 2 mg  2 mg Intramuscular TID PRN Weber, Kyra A, NP       feeding supplement (ENSURE ENLIVE / ENSURE PLUS) liquid 237 mL  237 mL Oral BID BM Attiah, Nadir, MD       magnesium  hydroxide (MILK OF MAGNESIA) suspension 30 mL  30 mL Oral Daily PRN Weber, Kyra A, NP       nicotine  polacrilex  (NICORETTE ) gum 2 mg  2 mg Oral PRN Onuoha, Chinwendu V, NP       QUEtiapine  (SEROQUEL  XR) 24 hr tablet 100 mg  100 mg Oral QHS Kerrigan Glendening H, NP   100 mg at 12/08/23 2123   PTA Medications: No medications prior to admission.    Musculoskeletal: Strength & Muscle Tone: within normal limits Gait & Station: normal Patient leans: N/A            Psychiatric Specialty Exam:  Presentation  General Appearance: Appropriate for Environment  Eye Contact:Good  Speech:Clear and Coherent; Normal Rate  Speech Volume:Normal  Handedness:No data recorded  Mood and Affect  Mood:Euthymic  Affect:Congruent   Thought Process  Thought Processes:Coherent  Duration of Psychotic Symptoms:N/A Past Diagnosis of Schizophrenia or Psychoactive disorder: No data recorded Descriptions of Associations:Intact  Orientation:Full (Time, Place and Person)  Thought Content:Logical  Hallucinations:Hallucinations: None  Ideas of Reference:None  Suicidal Thoughts:Suicidal Thoughts: No  Homicidal Thoughts:Homicidal Thoughts: No   Sensorium  Memory:Immediate Fair  Judgment:Poor  Insight:Poor   Executive Functions  Concentration:Fair  Attention Span:Fair  Recall:Fair  Fund of Knowledge:Fair  Language:Fair   Psychomotor Activity  Psychomotor Activity:Psychomotor Activity: Restlessness   Assets  Assets:Physical Health   Sleep  Sleep:Sleep: Fair    Physical Exam: Physical Exam Vitals and nursing note reviewed.  Constitutional:      General: He is not in acute distress.    Appearance: He is not ill-appearing.  Cardiovascular:     Rate and Rhythm: Normal rate.     Pulses: Normal pulses.  Pulmonary:     Effort: No respiratory distress.  Neurological:     Mental Status: He is alert and oriented to person, place, and time.    Review of Systems  Constitutional: Negative.   Respiratory:  Negative for shortness of breath.   Cardiovascular:  Negative for  chest pain and palpitations.  Gastrointestinal:  Negative for constipation, diarrhea, nausea and vomiting.  Genitourinary:  Negative for dysuria, frequency, hematuria and urgency.  Musculoskeletal:  Negative for falls.  Skin: Negative.   Neurological:  Negative for dizziness, tingling, tremors and headaches.  Psychiatric/Behavioral:  Positive for substance abuse. Negative for depression, hallucinations and suicidal ideas. The patient is nervous/anxious. The patient does not have insomnia.    Blood pressure (!) 142/94, pulse (!) 121, temperature 98.6 F (37 C), temperature source Oral, resp. rate 18, height 5\' 6"  (1.676 m), weight 51.7 kg, SpO2 100%. Body mass index is 18.4 kg/m.  Treatment Plan Summary: Daily contact with patient to assess and evaluate symptoms and progress in treatment and Medication management  ASSESSMENT: The patient is a 28 year old male presenting with significant substance use issues, primarily crack cocaine and cannabis, alongside a history of ADHD and borderline personality traits. The patient reports daily use of crack cocaine for the past two months and regular cannabis use, which has led to psychotic symptoms including auditory and visual hallucinations and paranoia. He has a history of ADHD diagnosed in childhood, previously treated with Vyvanse, which he discontinued at age 32. He denies any current medications or allergies. He reports poor sleep patterns and manic symptoms such as staying awake for extended periods under the influence of substances. Psychologically, the patient exhibits borderline personality traits such as fear of abandonment, unstable relationships, and chronic feelings of emptiness. He reports social anxiety and psychotic symptoms under the influence, including paranoia and hallucinations.    Diagnoses / Active Problems: Principal Problem:   Substance induced mood disorder (HCC)              PLAN: Safety and Monitoring:             --   Involuntary admission to inpatient psychiatric unit for safety, stabilization and treatment             -- Daily contact with patient to assess and evaluate symptoms and progress in treatment             -- Patient's case to be discussed in multi-disciplinary team meeting             -- Observation Level : q15 minute checks             --  Vital signs:  q12 hours             -- Precautions: suicide, elopement, and assault   2. Psychiatric Diagnoses and Treatment:    #Substance Induced Mood Disorder -- Initiate Seroquel  XR 100 mg, daily at bedtime   -- Discontinue lithium  due to refusal  -- Discontinue naltrexone  due to refusal    --  The risks/benefits/side-effects/alternatives to this medication were discussed in detail with the patient and time was given for questions. The patient consents to medication trial.  -- FDA -- Metabolic profile and EKG monitoring obtained while on an atypical antipsychotic (BMI: Lipid Panel: HbgA1c: QTc:)    #Continue As Needed Medications --Hydroxyzine 25 mg oral, 3 times daily as needed, anxiety --Trazodone 50 mg, oral, daily at bedtime as needed, sleep   #Continue BH Agitation Protocol --Haldol  5 mg, oral, 3 times daily as needed, mild agitation --Benadryl  50 mg, oral, 3 times daily as needed, mild agitation                                     OR  --Haldol  injection 5 mg, IM, 3 times daily as needed, moderate a  agitation --Benadryl  injection 50 mg, IM, 3 times daily as needed, moderate agitation --Ativan  injection 2 mg, IM, 3 times daily as needed, moderate agitation                                     OR --Haldol  injection 10 mg, IM, 3 times daily as needed, severe  agitation --Benadryl  injection 50 mg, IM, 3 times daily as needed, severe agitation --Ativan  injection 2 mg, IM, 3 times daily as needed, severe agitation     -- Encouraged patient to participate in unit milieu and in scheduled group therapies  -- Short Term Goals: Ability to identify  changes in lifestyle to reduce recurrence of condition will improve, Ability to verbalize feelings will improve, Ability to disclose and discuss suicidal ideas, Ability to demonstrate self-control will improve, Ability to identify and develop effective coping behaviors will improve, Ability to maintain clinical measurements within normal limits will improve, Compliance with prescribed medications will improve, and Ability to identify triggers associated with substance abuse/mental health issues will improve  -- Long Term Goals: Improvement in symptoms so as ready for discharge                3. Medical Issues Being Addressed:                 #Tobacco Use Disorder             -- Nicorette  gum 2 mg as needed  -- Declined Nicotine  patch              -- Smoking cessation encouraged   #Continue As Needed Medications --Tylenol  650 mg every 6 hours, as needed, mild pain, fever --Maalox/Mylanta 30 mL oral every 4 hours as needed, indigestion --Milk of Magnesia 30 mL oral daily as needed, mild constipation   4. Labs    CMP: Unremarkable CBC: RBC^ Otherwise within range TSH : 4.891^           Labs ordered by attending for 12/09/2023: HIV Antibody, T3, T4, Lipid panel, Hemoglobin A1c, Vitamin D , Vitamin B12, RPR, Folate  UDS: Positive for Cocaine, Barbiturates   EKG: QT/QTcB 385/416  5. Discharge Planning:  -- Social work and case management to assist with discharge planning and identification of hospital follow-up needs prior to discharge             -- Estimated LOS: 5-7 days -- Discharge Concerns: Need to establish a safety plan; Medication compliance and effectiveness -- Discharge Goals: Return home with outpatient referrals for mental health follow-up including medication management/psychotherapy      I certify that inpatient services furnished can reasonably be expected to improve the patient's condition.      Lennard Quirk, NP 4/18/20258:25 AM

## 2023-12-08 NOTE — Group Note (Signed)
 Date:  12/08/2023 Time:  3:19 PM  Group Topic/Focus:  Emotional Education:   The focus of this group is to discuss what feelings/emotions are, and how they are experienced.    Participation Level:  Did Not Attend  Participation Quality:  Did Not Attend  Affect:  Did Not Attend  Cognitive:  Did Not Attend  Insight: None  Engagement in Group:  Did Not Attend  Modes of Intervention:  Did Not Attend  Additional Comments:  Did Not Attend  Jeffrey Sharp 12/08/2023, 3:19 PM

## 2023-12-08 NOTE — Progress Notes (Signed)
 Pt appeared bright and pleasantly engaged in discussion.  Eye contact was fair to good and he was hyperverbal, rambling on about his situation in a tangential manner.  Pt had to be redirected to the present moment and what was needed to prepare for bedtime. Pt was calm/cooperative in communicating and verbalized understanding.  Shared information about Lithium but he did not seem aware of bipolar diagnosis with further discussion.  He shared that he was not bipolar and declined taking his Lithium.  Encouraged to write questions down and to communicate in treatment team as an empowered participant. Pt declined pain meds for 3/10 right cheek pain.  Pt denied SI/HI/AVH.   12/07/23 2200  Psych Admission Type (Psych Patients Only)  Admission Status Involuntary  Psychosocial Assessment  Patient Complaints Anxiety;Decreased concentration;Restlessness  Eye Contact Fair  Facial Expression Animated  Speech Tangential;Logical/coherent;Rapid  Interaction Assertive;Other (Comment) (hyperverbal)  Motor Activity Restless  Appearance/Hygiene Disheveled;In scrubs;Other (Comment) (Reported he took a shower today)  Behavior Characteristics Cooperative  Mood Pleasant;Anxious  Thought Process  Coherency Tangential  Content WDL  Delusions None reported or observed  Perception WDL  Hallucination None reported or observed  Judgment Impaired  Confusion None  Danger to Self  Current suicidal ideation? Denies  Self-Injurious Behavior No self-injurious ideation or behavior indicators observed or expressed   Agreement Not to Harm Self Yes  Description of Agreement Verbal  Danger to Others  Danger to Others None reported or observed

## 2023-12-08 NOTE — Group Note (Signed)
 Date:  12/08/2023 Time:  9:37 AM  Group Topic/Focus:  Goals Group:   The focus of this group is to help patients establish daily goals to achieve during treatment and discuss how the patient can incorporate goal setting into their daily lives to aide in recovery.    Participation Level:  Did Not Attend  Participation Quality:  Did Not Attend  Affect:  Did Not Attend  Cognitive:  Did Not Attend  Insight: None  Engagement in Group:  Did Not Attend  Modes of Intervention:  Did Not Attend  Additional Comments:  Did Not Attend  [name] 12/08/2023, 9:37 AM

## 2023-12-09 ENCOUNTER — Encounter (HOSPITAL_COMMUNITY): Payer: Self-pay

## 2023-12-09 DIAGNOSIS — F3181 Bipolar II disorder: Secondary | ICD-10-CM | POA: Diagnosis not present

## 2023-12-09 DIAGNOSIS — F1994 Other psychoactive substance use, unspecified with psychoactive substance-induced mood disorder: Principal | ICD-10-CM | POA: Insufficient documentation

## 2023-12-09 LAB — T4, FREE: Free T4: 0.74 ng/dL (ref 0.61–1.12)

## 2023-12-09 LAB — HEMOGLOBIN A1C
Hgb A1c MFr Bld: 5.3 % (ref 4.8–5.6)
Mean Plasma Glucose: 105 mg/dL

## 2023-12-09 LAB — RPR: RPR Ser Ql: NONREACTIVE

## 2023-12-09 MED ORDER — QUETIAPINE FUMARATE 25 MG PO TABS
75.0000 mg | ORAL_TABLET | Freq: Every day | ORAL | Status: DC
  Administered 2023-12-09: 75 mg via ORAL
  Filled 2023-12-09 (×2): qty 3

## 2023-12-09 MED ORDER — VITAMIN D (ERGOCALCIFEROL) 1.25 MG (50000 UNIT) PO CAPS
50000.0000 [IU] | ORAL_CAPSULE | Freq: Every day | ORAL | Status: DC
  Administered 2023-12-09 – 2023-12-11 (×3): 50000 [IU] via ORAL
  Filled 2023-12-09 (×6): qty 1

## 2023-12-09 NOTE — Group Note (Signed)
 Recreation Therapy Group Note   Group Topic:Leisure Education  Group Date: 12/09/2023 Start Time: 0930 End Time: 1030 Facilitators: Chasta Deshpande-McCall, LRT,CTRS Location: [address] Dayroom   Group Topic: Leisure Education   Goal Area(s) Addresses:  Patient will successfully identify positive leisure and recreation activities.  Patient will acknowledge benefits of participation in healthy leisure activities post discharge.  Patient will actively work with peers toward a shared goal.   Intervention: Competitive Group Game   Activity: Guess The Lyric. The game consisted of 6 categories ([address] [place], Indie, [employer], Hip Hop and Dance). In groups of 3-4, patients took turns flicking the spinner. Which every category, the team landed on, they had to finish the lyric to a song from that category. If they guessed the lyric correctly, the team keeps the card. There were also spaces on the spinner where a group could randomly pick a category of their choosing or steal a card from another team. The team with the most cards, wins the game.   Education:  Teacher, English as a foreign language, Stress Management, Discharge Planning  Education Outcome: Acknowledges education/In group clarification offered/Needs additional education   Affect/Mood: Appropriate   Participation Level: Engaged   Participation Quality: Independent   Behavior: Appropriate   Speech/Thought Process: Focused   Insight: Good   Judgement: Good   Modes of Intervention: Competitive Play   Patient Response to Interventions:  Engaged   Education Outcome:  In group clarification offered    Clinical Observations/Individualized Feedback: Pt was bright and engaged in group session. Pt was able to make some correct guesses with some of the lyrics he had. Pt was called out but later returned.     Plan: Continue to engage patient in RT group sessions 2-3x/week.   Manar Smalling-McCall, LRT,CTRS  12/09/2023 12:41 PM

## 2023-12-09 NOTE — Group Note (Signed)
 Date:  12/09/2023 Time:  8:47 PM  Group Topic/Focus:  Wrap-Up Group:   The focus of this group is to help patients review their daily goal of treatment and discuss progress on daily workbooks.    Participation Level:  Active  Participation Quality:  Appropriate  Affect:  Appropriate  Cognitive:  Appropriate  Insight: Appropriate  Engagement in Group:  Engaged  Modes of Intervention:  Activity and Socialization  Additional Comments:  Patient attended wrap up group.   Dillard Frame 12/09/2023, 8:47 PM

## 2023-12-09 NOTE — BH IP Treatment Plan (Addendum)
 Interdisciplinary Treatment and Diagnostic Plan Update  12/09/2023 Time of Session: 10:45 AM Kaisei Traweek MRN: [id number]  Principal Diagnosis: Substance induced mood disorder (HCC)  Secondary Diagnoses: Principal Problem:   Substance induced mood disorder (HCC)   Current Medications:  Current Facility-Administered Medications  Medication Dose Route Frequency Provider Last Rate Last Admin   acetaminophen  (TYLENOL ) tablet 650 mg  650 mg Oral Q6H PRN Weber, Kyra A, NP       alum & mag hydroxide-simeth (MAALOX/MYLANTA) 200-200-20 MG/5ML suspension 30 mL  30 mL Oral Q4H PRN Weber, Kyra A, NP       haloperidol  (HALDOL ) tablet 5 mg  5 mg Oral TID PRN Weber, Kyra A, NP       And   diphenhydrAMINE  (BENADRYL ) capsule 50 mg  50 mg Oral TID PRN Weber, Kyra A, NP       haloperidol  lactate (HALDOL ) injection 5 mg  5 mg Intramuscular TID PRN Weber, Kyra A, NP       And   diphenhydrAMINE  (BENADRYL ) injection 50 mg  50 mg Intramuscular TID PRN Weber, Kyra A, NP       And   LORazepam  (ATIVAN ) injection 2 mg  2 mg Intramuscular TID PRN Weber, Kyra A, NP       haloperidol  lactate (HALDOL ) injection 10 mg  10 mg Intramuscular TID PRN Weber, Kyra A, NP       And   diphenhydrAMINE  (BENADRYL ) injection 50 mg  50 mg Intramuscular TID PRN Weber, Kyra A, NP       And   LORazepam  (ATIVAN ) injection 2 mg  2 mg Intramuscular TID PRN Weber, Kyra A, NP       feeding supplement (ENSURE ENLIVE / ENSURE PLUS) liquid 237 mL  237 mL Oral BID BM Attiah, Nadir, MD   237 mL at 12/09/23 0902   magnesium  hydroxide (MILK OF MAGNESIA) suspension 30 mL  30 mL Oral Daily PRN Weber, Kyra A, NP       nicotine  polacrilex (NICORETTE ) gum 2 mg  2 mg Oral PRN Onuoha, Chinwendu V, NP       QUEtiapine  (SEROQUEL ) tablet 75 mg  75 mg Oral QHS Bennett, Christal H, NP       Vitamin D  (Ergocalciferol ) (DRISDOL ) 1.25 MG (50000 UNIT) capsule 50,000 Units  50,000 Units Oral Daily Parker, Alvin S, MD   50,000 Units at 12/09/23 1028   PTA  Medications: No medications prior to admission.    Patient Stressors:    Patient Strengths:    Treatment Modalities: Medication Management, Group therapy, Case management,  1 to 1 session with clinician, Psychoeducation, Recreational therapy.   Physician Treatment Plan for Primary Diagnosis: Substance induced mood disorder (HCC) Long Term Goal(s):     Short Term Goals:    Medication Management: Evaluate patient's response, side effects, and tolerance of medication regimen.  Therapeutic Interventions: 1 to 1 sessions, Unit Group sessions and Medication administration.  Evaluation of Outcomes: Not Progressing  Physician Treatment Plan for Secondary Diagnosis: Principal Problem:   Substance induced mood disorder (HCC)  Long Term Goal(s):     Short Term Goals:       Medication Management: Evaluate patient's response, side effects, and tolerance of medication regimen.  Therapeutic Interventions: 1 to 1 sessions, Unit Group sessions and Medication administration.  Evaluation of Outcomes: Not Progressing   RN Treatment Plan for Primary Diagnosis: Substance induced mood disorder (HCC) Long Term Goal(s): Knowledge of disease and therapeutic regimen to maintain health will improve and  Short Term Goals: Ability to remain free from injury will improve, Ability to verbalize frustration and anger appropriately will improve, Ability to demonstrate self-control, Ability to participate in decision making will improve, Ability to verbalize feelings will improve, Ability to disclose and discuss suicidal ideas, Ability to identify and develop effective coping behaviors will improve, and Compliance with prescribed medications will improve  Medication Management: RN will administer medications as ordered by provider, will assess and evaluate patient's response and provide education to patient for prescribed medication. RN will report any adverse and/or side effects to prescribing  provider.  Therapeutic Interventions: 1 on 1 counseling sessions, Psychoeducation, Medication administration, Evaluate responses to treatment, Monitor vital signs and CBGs as ordered, Perform/monitor CIWA, COWS, AIMS and Fall Risk screenings as ordered, Perform wound care treatments as ordered.  Evaluation of Outcomes: Not Progressing   LCSW Treatment Plan for Primary Diagnosis: Substance induced mood disorder (HCC) Long Term Goal(s): Safe transition to appropriate next level of care at discharge, Engage patient in therapeutic group addressing interpersonal concerns.  Short Term Goals: Engage patient in aftercare planning with referrals and resources, Increase ability to appropriately verbalize feelings, Facilitate acceptance of mental health diagnosis and concerns, and Identify triggers associated with mental health/substance abuse issues  Therapeutic Interventions: Assess for all discharge needs, 1 to 1 time with Social worker, Explore available resources and support systems, Assess for adequacy in community support network, Educate family and significant other(s) on suicide prevention, Complete Psychosocial Assessment, Interpersonal group therapy.  Evaluation of Outcomes: Not Progressing   Progress in Treatment: Attending groups: No. Participating in groups: No. Taking medication as prescribed: Yes. Toleration medication: Yes. Family/Significant other contact made: No, patient declined consents Patient understands diagnosis: Yes. Discussing patient identified problems/goals with staff: Yes. Medical problems stabilized or resolved: Yes. Denies suicidal/homicidal ideation: Yes. Issues/concerns per patient self-inventory: No.  New problem(s) identified:  No  New Short Term/Long Term Goal(s):   medication stabilization, elimination of SI thoughts, development of comprehensive mental wellness plan.   Patient Goals:  I'm sober and I don't have any cravings.  I used crack cocaine on  Monday.  I want to get off drugs but I don't want to go to inpatient treatment.    Discharge Plan or Barriers:  Patient recently admitted. CSW will continue to follow and assess for appropriate referrals and possible discharge planning.    Reason for Continuation of Hospitalization: Medication stabilization Substance use  Estimated Length of Stay:  5 - 7 days  Last 3 [place] Suicide Severity Risk Score: Flowsheet Row Ad[facility] (Current) [facility] from 12/06/2023 in [facility] from 11/25/2023 in [facility] Department at [facility]  C-SSRS RISK CATEGORY No Risk No Risk No Risk       Last PHQ 2/9 Scores:     No data to display          Scribe for Treatment Team: Bubba Vanbenschoten O Brittany Amirault, LCSWA 12/09/2023 3:28 PM

## 2023-12-09 NOTE — Group Note (Signed)
 Date:  12/09/2023 Time:  4:48 PM  Group Topic/Focus:  Goals Group:   The focus of this group is to help patients establish daily goals to achieve during treatment and discuss how the patient can incorporate goal setting into their daily lives to aide in recovery. Orientation:   The focus of this group is to educate the patient on the purpose and policies of crisis stabilization and provide a format to answer questions about their admission.  The group details unit policies and expectations of patients while admitted.    Participation Level:  Did Not Attend   Sheryl Donna 12/09/2023, 4:48 PM

## 2023-12-09 NOTE — BHH Counselor (Signed)
 Adult Comprehensive Assessment  Patient ID: Jeffrey Sharp, male   DOB: 1996-03-26, 28 y.o.   MRN: [id number]  Information Source: Information source: Patient  Current Stressors:  Patient states their primary concerns and needs for treatment are:: "Drugs." Patient states their goals for this hospitilization and ongoing recovery are:: "I've been wanting to get off drugs but couldn't get away from where I'm at." Educational / Learning stressors: Patient denies. Employment / Job issues: Patient denies. Family Relationships: Patient reports using with his uncle. Financial / Lack of resources (include bankruptcy): "Yes." Housing / Lack of housing: Patient reports that he is unhoused. Physical health (include injuries & life threatening diseases): Patient reports high blood pressure and eye pain. Social relationships: Patient reports that he wants to be sober but was living with someone who continued to buy it. Substance abuse: Patient reports "crack" use. Bereavement / Loss: Patient denies.  Living/Environment/Situation:  Living Arrangements: Other (Comment) Living conditions (as described by patient or guardian): Patient reports he is unhoused. Who else lives in the home?: Patient currently lives alone. How long has patient lived in current situation?: Patient is unsure. What is atmosphere in current home: Other (Comment)  Family History:  Marital status: Single Are you sexually active?: No What is your sexual orientation?: "Straight." Has your sexual activity been affected by drugs, alcohol, medication, or emotional stress?: Patient denies. Does patient have children?: Yes How many children?: 1 How is patient's relationship with their children?: "I love him and he loves me but he's with his mom in [place]."  Childhood History:  By whom was/is the patient raised?: Mother, Mother/father and step-parent Description of patient's relationship with caregiver when they were a child:  "Great." Patient's description of current relationship with people who raised him/her: "Great other than I don't hear from my step dad anymore." How were you disciplined when you got in trouble as a child/adolescent?: "I was disciplined right." Does patient have siblings?: Yes Number of Siblings: 5 Description of patient's current relationship with siblings: "It's fine." Did patient suffer any verbal/emotional/physical/sexual abuse as a child?: No Did patient suffer from severe childhood neglect?: No Has patient ever been sexually abused/assaulted/raped as an adolescent or adult?: No Was the patient ever a victim of a crime or a disaster?: No Witnessed domestic violence?: No Has patient been affected by domestic violence as an adult?: No  Education:  Highest grade of school patient has completed: "I graduated high school and have some college." Currently a Consulting civil engineer?: No Learning disability?: No  Employment/Work Situation:   Employment Situation: Employed Where is Patient Currently Employed?: 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 How Long has Patient Been Employed?: "Since 2018." Are You Satisfied With Your Job?: Yes Do You Work More Than One Job?: No Work Stressors: Patient denies. Patient's Job has Been Impacted by Current Illness: No What is the Longest Time Patient has Held a Job?: Patient's current job. 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?: No  Financial Resources:   Financial resources: Income from employment Does patient have a representative payee or guardian?: No  Alcohol/Substance Abuse:   What has been your use of drugs/alcohol within the last 12 months?: Patient reports minimal alcohol use, daily crack use. If attempted suicide, did drugs/alcohol play a role in this?:  No Alcohol/Substance Abuse Treatment Hx: Past Tx, Inpatient If yes, describe treatment: Patient reports  that [facility] to [facility]. Has alcohol/substance abuse ever caused legal problems?: No  Social Support System:   Patient's Community Support System: Good Describe Community Support System: "My mom and older sister." Type of faith/religion: "I'm a Christian." How does patient's faith help to cope with current illness?: "That's my support system now."  Leisure/Recreation:   Do You Have Hobbies?: No  Strengths/Needs:   What is the patient's perception of their strengths?: "I know how to deal." Patient states they can use these personal strengths during their treatment to contribute to their recovery: "Determination." Patient states these barriers may affect/interfere with their treatment: Patient denies. Patient states these barriers may affect their return to the community: None reported. Other important information patient would like considered in planning for their treatment: None reported.  Discharge Plan:   Currently receiving community mental health services: No Patient states concerns and preferences for aftercare planning are: None reported. Patient states they will know when they are safe and ready for discharge when: "I feel like I already am." Does patient have access to transportation?: No Does patient have financial barriers related to discharge medications?: No Patient description of barriers related to discharge medications: None reported. Plan for no access to transportation at discharge: CSW to arrange transportation services at discharge on patient's behalf. Will patient be returning to same living situation after discharge?:  (Patient is unsure. Patient reports he may live with his sister at discharge.)  Summary/Recommendations:   Summary and Recommendations (to be completed by the evaluator): Patient is a 28 year old male admitted after he was found passed out by  a family member. He carries the psychiatric diagnoses of none and has a past medical history of HSV infection. Patient is currently having difficulty remembering what led him to the emergency department and eventually to the [facility] hospital.  Upon admission, patient had a UDS positive for cocaine and THC. Patient reports several attempts to "Stop" but reports "can't get away from where I'm at." Patient also reports using substances with his uncle. Patient denies all stressors. Patient reports currently being unhoused but was unable to share how long. Patient mentioned that he was living with family prior to being unhoused. 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 Patient reports being satisfied with his job. Patient endorsed minimal alcohol use and daily "crack" use. Patient reports receiving adequate support from his mother and older sister. Patient is not currently receiving outpatient therapy and declined a referral. Patient believes that he may be able to live with his sister at discharge. Patient denies SI/HI and AVH. Recommendations include: crisis stabilization, therapeutic milieu, encourage group attendance and participation, medication management for mood stabilization and development of comprehensive mental wellness/sobriety plan.  Jeffrey Sharp. 12/09/2023

## 2023-12-09 NOTE — BHH Group Notes (Signed)
 BHH Group Notes:  (Nursing/MHT/Case Management/Adjunct)  Date:  12/09/2023  Time:  8:18 PM  Type of Therapy:   Wrap Up Group  Participation Level:  Active  Participation Quality:  Appropriate  Affect:  Appropriate  Cognitive:  Appropriate  Insight:  Appropriate and Improving  Engagement in Group:  Engaged  Modes of Intervention:  Discussion and Support  Summary of Progress/Problems: Patient participated in group appropriately.  Jeffrey Sharp 12/09/2023, 8:18 PM

## 2023-12-09 NOTE — Plan of Care (Signed)
   Problem: Education: Goal: Emotional status will improve Outcome: Progressing Goal: Mental status will improve Outcome: Progressing   Problem: Activity: Goal: Interest or engagement in activities will improve Outcome: Progressing Goal: Sleeping patterns will improve Outcome: Progressing   Problem: Safety: Goal: Periods of time without injury will increase Outcome: Progressing

## 2023-12-09 NOTE — Group Note (Signed)
 Date:  12/09/2023 Time:  4:56 PM  Setbacks in Recovery: The purpose of the group was to utilize concepts from CBT therapy to promote emotional wellness. More specifically, we identified and defined unhelpful thought patterns that could be the first step in changing the way participants think while in recovery from a mental health crisis.    Participation Level:  Did Not Attend    Jeffrey Sharp 12/09/2023, 4:56 PM

## 2023-12-09 NOTE — Progress Notes (Signed)
   12/08/23 2139  Psych Admission Type (Psych Patients Only)  Admission Status Involuntary  Psychosocial Assessment  Patient Complaints Anxiety;Depression  Eye Contact Fair  Facial Expression Animated  Affect Appropriate to circumstance  Speech Logical/coherent  Interaction Assertive  Motor Activity Restless  Appearance/Hygiene Disheveled  Behavior Characteristics Cooperative;Appropriate to situation  Mood Anxious;Pleasant  Thought Process  Coherency Tangential  Content WDL  Delusions None reported or observed  Perception WDL  Hallucination None reported or observed  Judgment Impaired  Confusion None  Danger to Self  Current suicidal ideation? Denies  Agreement Not to Harm Self Yes  Description of Agreement verbal  Danger to Others  Danger to Others None reported or observed

## 2023-12-09 NOTE — Progress Notes (Signed)
   12/09/23 1141  Psych Admission Type (Psych Patients Only)  Admission Status Involuntary  Psychosocial Assessment  Patient Complaints Anxiety  Eye Contact Fair  Facial Expression Anxious  Affect Anxious  Speech Soft  Interaction Avoidant  Motor Activity Fidgety;Restless  Appearance/Hygiene Disheveled  Behavior Characteristics Anxious  Mood Anxious  Thought Process  Coherency Tangential;Loose associations  Content WDL  Delusions None reported or observed  Perception WDL  Hallucination None reported or observed  Judgment Impaired  Confusion Mild  Danger to Self  Current suicidal ideation? Denies  Self-Injurious Behavior No self-injurious ideation or behavior indicators observed or expressed   Agreement Not to Harm Self Yes  Description of Agreement Verbal  Danger to Others  Danger to Others None reported or observed

## 2023-12-09 NOTE — Progress Notes (Signed)
 Initial [facility] MD Progress Note  12/09/2023 2:50 PM Jeffrey Sharp  MRN:  [id number] Principal Problem: Substance induced mood disorder (HCC) Diagnosis: Principal Problem:   Substance induced mood disorder (HCC)  History of Present Illness: Jeffrey Sharp, a 28 year old male presented to [facility] emergency department on December 06, 2023, with concerns related to substance use.  He was brought in via EMS at the request of family members, who reported finding him unresponsive.  Upon attempting to administer Narcan, the patient was reportedly observed to be awake and alert, but his family remain concerned about ongoing drug use.  The patient reportedly admitted to recent use of crack cocaine and marijuana.  He does not have a documented psychiatric history but has a medical history notable for HSV infection.  He was subsequently admitted to the [facility] on the same day for further stabilization and treatment.   Chart review on admission: Per affidavit and petition "respondent stated to petitioner that he does not want to live anymore and that he can't take it, he wants to end it and not be here anymore. Respondent used cocaine and THC per petitioner. Respondent is hearing voices and does not make sense at times. Respondent is having erratic behavior and mood swings. Respondent told petitioner he hasn't slept in days and is doing any drug he can get his hands on. Showed up at petitioner's sister's house under influence of unknown drug and passed out and hit his head."   Chart review from last 24 hours: Chart reviewed today. Patient discussed at multidisciplinary team meeting. Blood pressure 142/94, Pulse Rate 121 this morning  Blood pressure 124/71, Pulse Rate 98 on recheck. Documented sleep hours: 7.75.  Nursing staff reported patient has not been out of his room today, however, he denies SI/HI AVH. He is compliant with routine psychotropic medication regimen without difficulty. He did not  require as needed medication overnight, according to the nursing record.    Yesterday the psychiatry team made the following recommendations: - Initiate Seroquel  XR 100 mg, daily at bedtime - Discontinue lithium  due to refusal - Discontinue naltrexone  due to refusal  Daily Evaluation: On today's assessment, the patient reports improvement in mood and anxiety.  Sleep is at a manageable level.  Appetite is satisfactory.  Concentration is improving.   Energy level is adequate.  Denies suicidal thoughts. Denies suicidal intent and plan.  Denies having any HI.  Denies having psychotic symptoms.  Reports participating in group therapy sessions on the unit and finds them beneficial.  Identifies coping skills including writing music and reading.  Describes his daily goal is simply "getting through another day." Discussed the following psychosocial stressors: Substance use and social anxiety.   No EPS symptoms were reported or observed during the assessment.    Regarding side effects to current psychiatric medications, the patient reports difficulty waking this morning and ongoing symptoms of daytime drowsiness, which he attributes to Seroquel . We discussed changes to current medication regimen, including the recommendation to decrease Seroquel  from 100 mg to 75 mg nightly. The patient verbally consents to this adjustment.   Reports need for an eye exam and that he previously wore eyeglasses, which she has since lost. Reports he has had current contact lenses in for approximately 1 month.  States he is unable to go outside during hospitalization due to poor vision and lack of shoes.  Refuses to remove contact lenses despite recommendation, stating he needs them to drive when discharged.  Patient appears to be gradually  improving in mood and anxiety symptoms.  He is actively participating in unit groups and identifies appropriate coping strategies.  He is experiencing excessive daytime drowsiness,  likely related to Seroquel .  Medication adjustment was discussed, and the patient provided verbal consent to reduce Seroquel  from 100 to 75 mg nightly in an effort to mitigate sedation.  The case was discussed with the attending psychiatrist, Catarina Cline, and the dose change was agreed upon. Continued hospitalization necessary at this time to monitor for safety, mood symptoms, and medication response.    Total Time spent with patient: 45 minutes  Past Psychiatric History: See H&P  Past Medical History: History reviewed. No pertinent past medical history. History reviewed. No pertinent surgical history. Family History: History reviewed. No pertinent family history. Family Psychiatric  History: See H&P Social History:  Social History   Substance and Sexual Activity  Alcohol Use Yes     Social History   Substance and Sexual Activity  Drug Use No    Social History   Socioeconomic History   Marital status: Single    Spouse name: Not on file   Number of children: Not on file   Years of education: Not on file   Highest education level: Not on file  Occupational History   Not on file  Tobacco Use   Smoking status: Every Day    Current packs/day: 0.50    Types: Cigarettes   Smokeless tobacco: Never  Substance and Sexual Activity   Alcohol use: Yes   Drug use: No   Sexual activity: Not on file  Other Topics Concern   Not on file  Social History Narrative   ** Merged History Encounter **       Social Drivers of Health   Financial Resource Strain: Not on file  Food Insecurity: Food Insecurity Present (12/07/2023)   Hunger Vital Sign    Worried About Running Out of Food in the Last Year: Often true    Ran Out of Food in the Last Year: Often true  Transportation Needs: Unmet Transportation Needs (12/07/2023)   PRAPARE - Administrator, Civil Service (Medical): Yes    Lack of Transportation (Non-Medical): Yes  Physical Activity: Not on file  Stress: Not on file   Social Connections: Not on file   Additional Social History:                          Current Medications: Current Facility-Administered Medications  Medication Dose Route Frequency Provider Last Rate Last Admin   acetaminophen  (TYLENOL ) tablet 650 mg  650 mg Oral Q6H PRN Weber, Kyra A, NP       alum & mag hydroxide-simeth (MAALOX/MYLANTA) 200-200-20 MG/5ML suspension 30 mL  30 mL Oral Q4H PRN Weber, Kyra A, NP       haloperidol  (HALDOL ) tablet 5 mg  5 mg Oral TID PRN Weber, Kyra A, NP       And   diphenhydrAMINE  (BENADRYL ) capsule 50 mg  50 mg Oral TID PRN Weber, Kyra A, NP       haloperidol  lactate (HALDOL ) injection 5 mg  5 mg Intramuscular TID PRN Weber, Kyra A, NP       And   diphenhydrAMINE  (BENADRYL ) injection 50 mg  50 mg Intramuscular TID PRN Weber, Kyra A, NP       And   LORazepam  (ATIVAN ) injection 2 mg  2 mg Intramuscular TID PRN Weber, Kyra A, NP  haloperidol  lactate (HALDOL ) injection 10 mg  10 mg Intramuscular TID PRN Weber, Kyra A, NP       And   diphenhydrAMINE  (BENADRYL ) injection 50 mg  50 mg Intramuscular TID PRN Weber, Kyra A, NP       And   LORazepam  (ATIVAN ) injection 2 mg  2 mg Intramuscular TID PRN Weber, Kyra A, NP       feeding supplement (ENSURE ENLIVE / ENSURE PLUS) liquid 237 mL  237 mL Oral BID BM Attiah, Nadir, MD   237 mL at 12/09/23 0902   magnesium  hydroxide (MILK OF MAGNESIA) suspension 30 mL  30 mL Oral Daily PRN Weber, Kyra A, NP       nicotine  polacrilex (NICORETTE ) gum 2 mg  2 mg Oral PRN Onuoha, Chinwendu V, NP       QUEtiapine  (SEROQUEL ) tablet 75 mg  75 mg Oral QHS Jazzlynn Rawe H, NP       Vitamin D  (Ergocalciferol ) (DRISDOL ) 1.25 MG (50000 UNIT) capsule 50,000 Units  50,000 Units Oral Daily Parker, Alvin S, MD   50,000 Units at 12/09/23 1028    Lab Results:  Results for orders placed or performed during the hospital encounter of 12/07/23 (from the past 48 hours)  Folate     Status: None   Collection Time: 12/08/23   6:29 PM  Result Value Ref Range   Folate 9.7 >5.9 ng/mL    Comment: Performed at [facility], [address] [place], [place] [address]  RPR     Status: None   Collection Time: 12/08/23  6:29 PM  Result Value Ref Range   RPR Ser Ql NON REACTIVE NON REACTIVE    Comment: Performed at [facility], [address], [place], [place] [address]  Vitamin B12     Status: None   Collection Time: 12/08/23  6:29 PM  Result Value Ref Range   Vitamin B-12 216 180 - 914 pg/mL    Comment: (NOTE) This assay is not validated for testing neonatal or myeloproliferative syndrome specimens for Vitamin B12 levels. Performed at [facility], [address] [place], [place] [address]   VITAMIN D  25 Hydroxy (Vit-D Deficiency, Fractures)     Status: Abnormal   Collection Time: 12/08/23  6:29 PM  Result Value Ref Range   Vit D, 25-Hydroxy 14.16 (L) 30 - 100 ng/mL    Comment: (NOTE) Vitamin D  deficiency has been defined by the Institute of Medicine  and an Endocrine Society practice guideline as a level of serum 25-OH  vitamin D  less than 20 ng/mL (1,2). The Endocrine Society went on to  further define vitamin D  insufficiency as a level between 21 and 29  ng/mL (2).  1. IOM (Institute of Medicine). 2010. Dietary reference intakes for  calcium and D. [place]: [employer]. 2. Holick MF, Binkley Foster, Bischoff-Ferrari HA, et al. Evaluation,  treatment, and prevention of vitamin D  deficiency: an Endocrine  Society clinical practice guideline, JCEM. 2011 Jul; 96(7): 1911-30.  Performed at [facility], [address] [place], [place] [address]   Hemoglobin A1c     Status: None   Collection Time: 12/08/23  6:29 PM  Result Value Ref Range   Hgb A1c MFr Bld 5.3 4.8 - 5.6 %    Comment: (NOTE)         Prediabetes: 5.7 - 6.4         Diabetes: >6.4  Glycemic control for adults with diabetes: <7.0    Mean Plasma Glucose 105 mg/dL     Comment: (NOTE) Performed At: [facility]  [address] [place], [place] [id number] Pearlean Botts MD NW:[id number]   Lipid panel     Status: Abnormal   Collection Time: 12/08/23  6:29 PM  Result Value Ref Range   Cholesterol 155 0 - 200 mg/dL   Triglycerides 578 (H) <150 mg/dL   HDL 53 >46 mg/dL   Total CHOL/HDL Ratio 2.9 RATIO   VLDL 38 0 - 40 mg/dL   LDL Cholesterol 64 0 - 99 mg/dL    Comment:        Total Cholesterol/HDL:CHD Risk Coronary Heart Disease Risk Table                     Men   Women  1/2 Average Risk   3.4   3.3  Average Risk       5.0   4.4  2 X Average Risk   9.6   7.1  3 X Average Risk  23.4   11.0        Use the calculated Patient Ratio above and the CHD Risk Table to determine the patient's CHD Risk.        ATP III CLASSIFICATION (LDL):  <100     mg/dL   Optimal  962-952  mg/dL   Near or Above                    Optimal  130-159  mg/dL   Borderline  841-324  mg/dL   High  >401     mg/dL   Very High Performed at [facility], [address] [place], [place] [address]   T4, free     Status: None   Collection Time: 12/08/23  6:29 PM  Result Value Ref Range   Free T4 0.74 0.61 - 1.12 ng/dL    Comment: (NOTE) Biotin ingestion may interfere with free T4 tests. If the results are inconsistent with the TSH level, previous test results, or the clinical presentation, then consider biotin interference. If needed, order repeat testing after stopping biotin. Performed at [facility], [address], [place], [place] [address]   HIV Antibody (routine testing w rflx)     Status: None   Collection Time: 12/08/23  6:29 PM  Result Value Ref Range   HIV Screen 4th Generation wRfx Non Reactive Non Reactive    Comment: Performed at [facility], [address], [place], [place] [address]    Blood Alcohol level:  Lab Results  Component Value Date   ETH <10 12/07/2023    Metabolic Disorder Labs: Lab Results   Component Value Date   HGBA1C 5.3 12/08/2023   MPG 105 12/08/2023   No results found for: "PROLACTIN" Lab Results  Component Value Date   CHOL 155 12/08/2023   TRIG 189 (H) 12/08/2023   HDL 53 12/08/2023   CHOLHDL 2.9 12/08/2023   VLDL 38 12/08/2023   LDLCALC 64 12/08/2023    Physical Findings: AIMS:  , ,  0,  ,    CIWA:   N/A COWS:   N/A  Musculoskeletal: Strength & Muscle Tone: within normal limits Gait & Station: normal Patient leans: N/A  Psychiatric Specialty Exam:  Presentation  General Appearance:  Appropriate for Environment  Eye Contact: Good  Speech: Clear and Coherent; Normal Rate  Speech Volume: Normal  Handedness:No data  recorded  Mood and Affect  Mood: Euthymic  Affect: Congruent   Thought Process  Thought Processes: Coherent  Descriptions of Associations:Intact  Orientation:Full (Time, Place and Person)  Thought Content:Logical  History of Schizophrenia/Schizoaffective disorder:No data recorded Duration of Psychotic Symptoms:No data recorded Hallucinations:Hallucinations: None  Ideas of Reference:None  Suicidal Thoughts:Suicidal Thoughts: No  Homicidal Thoughts:Homicidal Thoughts: No   Sensorium  Memory: Immediate Fair  Judgment: Poor  Insight: Poor   Executive Functions  Concentration: Fair  Attention Span: Fair  Recall: Fair  Fund of Knowledge: Fair  Language: Fair   Psychomotor Activity  Psychomotor Activity: Psychomotor Activity: Restlessness   Assets  Assets: Physical Health   Sleep  Sleep: Sleep: Fair    Physical Exam: Physical Exam Vitals and nursing note reviewed.  Constitutional:      General: He is not in acute distress.    Appearance: He is not ill-appearing.  Cardiovascular:     Rate and Rhythm: Tachycardia present.  Pulmonary:     Effort: No respiratory distress.  Neurological:     Mental Status: He is alert and oriented to person, place, and time.     ROS Blood pressure (!) 142/94, pulse (!) 121, temperature 98.6 F (37 C), temperature source Oral, resp. rate 18, height 5\' 6"  (1.676 m), weight 51.7 kg, SpO2 100%. Body mass index is 18.4 kg/m.   Treatment Plan Summary: Daily contact with patient to assess and evaluate symptoms and progress in treatment and Medication management   ASSESSMENT: The patient is a 28 year old male presenting with significant substance use issues, primarily crack cocaine and cannabis, alongside a history of ADHD and borderline personality traits. The patient reports daily use of crack cocaine for the past two months and regular cannabis use, which has led to psychotic symptoms including auditory and visual hallucinations and paranoia. He has a history of ADHD diagnosed in childhood, previously treated with Vyvanse, which he discontinued at age 31. He denies any current medications or allergies. He reports poor sleep patterns and manic symptoms such as staying awake for extended periods under the influence of substances. Psychologically, the patient exhibits borderline personality traits such as fear of abandonment, unstable relationships, and chronic feelings of emptiness. He reports social anxiety and psychotic symptoms under the influence, including paranoia and hallucinations.    Diagnoses / Active Problems: Principal Problem:   Substance induced mood disorder (HCC)              PLAN: Safety and Monitoring: --  Involuntary admission to inpatient psychiatric unit for safety, stabilization and treatment -- Daily contact with patient to assess and evaluate symptoms and progress in treatment             -- Patient's case to be discussed in multi-disciplinary team meeting             -- Observation Level : q15 minute checks             -- Vital signs:  q12 hours             -- Precautions: suicide, elopement, and assault   2. Psychiatric Diagnoses and Treatment:    #Substance Induced Mood Disorder --  Discontinue Seroquel  XR 100 mg, daily at bedtime due to patient's complaints of daytime drowsiness. -- Initiate Seroquel  75 mg, daily at bedtime    --  The risks/benefits/side-effects/alternatives to this medication were discussed in detail with the patient and time was given for questions. The patient consents to medication trial.  -- FDA --  Metabolic profile and EKG monitoring obtained while on an atypical antipsychotic (BMI: Lipid Panel: HbgA1c: QTc:)    #Continue As Needed Medications --Hydroxyzine 25 mg oral, 3 times daily as needed, anxiety --Trazodone 50 mg, oral, daily at bedtime as needed, sleep   #Continue BH Agitation Protocol --Haldol  5 mg, oral, 3 times daily as needed, mild agitation --Benadryl  50 mg, oral, 3 times daily as needed, mild agitation                                     OR  --Haldol  injection 5 mg, IM, 3 times daily as needed, moderate                 agitation --Benadryl  injection 50 mg, IM, 3 times daily as needed, moderate agitation --Ativan  injection 2 mg, IM, 3 times daily as needed, moderate agitation                                     OR --Haldol  injection 10 mg, IM, 3 times daily as needed, severe           agitation --Benadryl  injection 50 mg, IM, 3 times daily as needed, severe agitation --Ativan  injection 2 mg, IM, 3 times daily as needed, severe agitation     -- Encouraged patient to participate in unit milieu and in scheduled group therapies  -- Short Term Goals: Ability to identify changes in lifestyle to reduce recurrence of condition will improve, Ability to verbalize feelings will improve, Ability to disclose and discuss suicidal ideas, Ability to demonstrate self-control will improve, Ability to identify and develop effective coping behaviors will improve, Ability to maintain clinical measurements within normal limits will improve, Compliance with prescribed medications will improve, and Ability to identify triggers associated with substance  abuse/mental health issues will improve  -- Long Term Goals: Improvement in symptoms so as ready for discharge                3. Medical Issues Being Addressed:                 #Tobacco Use Disorder             -- Nicorette  gum 2 mg as needed  -- Declined Nicotine  patch              -- Smoking cessation encouraged   #Continue As Needed Medications --Tylenol  650 mg every 6 hours, as needed, mild pain, fever --Maalox/Mylanta 30 mL oral every 4 hours as needed, indigestion --Milk of Magnesia 30 mL oral daily as needed, mild constipation   4. Labs    CMP: Unremarkable CBC: RBC^ Otherwise within range TSH : 4.891^           Labs ordered by attending for 12/09/2023  Labs reviewed today:  RPR  HIV Antibody: Non Reactive T3  T4  Folate  Hemoglobin A1c  Vitamin B12: All within range  Lipid panel: Triglycerides 189^ Vitamin D : Low at 14.16    UDS: Positive for Cocaine, Barbiturates   EKG: QT/QTcB 385/416     5. Discharge Planning:  -- Social work and case management to assist with discharge planning and identification of hospital follow-up needs prior to discharge             -- Estimated LOS: 5-7 days -- Discharge Concerns: Need to  establish a safety plan; Medication compliance and effectiveness -- Discharge Goals: Return home with outpatient referrals for mental health follow-up including medication management/psychotherapy      I certify that inpatient services furnished can reasonably be expected to improve the patient's condition.     Lennard Quirk, NP 12/09/2023, 2:50 PM

## 2023-12-09 NOTE — Plan of Care (Signed)
   Problem: Education: Goal: Knowledge of [facility] Education information/materials will improve Outcome: Progressing Goal: Emotional status will improve Outcome: Progressing Goal: Mental status will improve Outcome: Progressing Goal: Verbalization of understanding the information provided will improve Outcome: Progressing

## 2023-12-09 NOTE — [facility] Suicide Risk Assessment (Signed)
 [facility] INPATIENT:  Family/Significant Other Suicide Prevention Education  Suicide Prevention Education:  Patient Refusal for Family/Significant Other Suicide Prevention Education: The patient Jeffrey Sharp has refused to provide written consent for family/significant other to be provided Family/Significant Other Suicide Prevention Education during admission and/or prior to discharge.  Physician notified.  SPE completed with pt, as pt refused to consent to family contact. SPI pamphlet provided to pt and pt was encouraged to share information with support network, ask questions, and talk about any concerns relating to SPE. Pt denies access to guns/firearms and verbalized understanding of information provided. Mobile Crisis information also provided to pt.    Tineshia Becraft M Alpha Chouinard 12/09/2023, 2:25 PM

## 2023-12-10 DIAGNOSIS — E559 Vitamin D deficiency, unspecified: Secondary | ICD-10-CM | POA: Insufficient documentation

## 2023-12-10 DIAGNOSIS — F3181 Bipolar II disorder: Secondary | ICD-10-CM | POA: Diagnosis not present

## 2023-12-10 LAB — T3, FREE: T3, Free: 3.8 pg/mL (ref 2.0–4.4)

## 2023-12-10 MED ORDER — QUETIAPINE FUMARATE 50 MG PO TABS
50.0000 mg | ORAL_TABLET | Freq: Every day | ORAL | Status: DC
  Administered 2023-12-10: 50 mg via ORAL
  Filled 2023-12-10 (×3): qty 1

## 2023-12-10 NOTE — Progress Notes (Signed)
   12/09/23 2200  Psych Admission Type (Psych Patients Only)  Admission Status Involuntary  Psychosocial Assessment  Patient Complaints None  Eye Contact Fair  Facial Expression Anxious  Affect Apathetic  Speech Soft;Logical/coherent  Interaction Assertive  Motor Activity Restless  Appearance/Hygiene Disheveled  Behavior Characteristics Anxious;Cooperative  Mood Anxious;Pleasant  Thought Process  Coherency Tangential  Content WDL  Delusions None reported or observed  Perception WDL  Hallucination None reported or observed  Judgment Impaired  Confusion None  Danger to Self  Current suicidal ideation? Denies  Self-Injurious Behavior No self-injurious ideation or behavior indicators observed or expressed   Agreement Not to Harm Self Yes  Description of Agreement verbal  Danger to Others  Danger to Others None reported or observed

## 2023-12-10 NOTE — Progress Notes (Signed)
   12/10/23 2300  Psych Admission Type (Psych Patients Only)  Admission Status Involuntary  Psychosocial Assessment  Patient Complaints None  Eye Contact Fair  Facial Expression Animated  Affect Appropriate to circumstance  Speech Logical/coherent  Interaction Assertive  Motor Activity Other (Comment) (WNL)  Appearance/Hygiene Unremarkable  Behavior Characteristics Calm;Cooperative  Mood Pleasant  Thought Process  Coherency WDL  Content WDL  Delusions None reported or observed  Perception WDL  Hallucination None reported or observed  Judgment Impaired  Confusion None  Danger to Self  Current suicidal ideation? Denies  Description of Suicide Plan None  Self-Injurious Behavior No self-injurious ideation or behavior indicators observed or expressed   Agreement Not to Harm Self Yes  Description of Agreement Verbal contract for safety  Danger to Others  Danger to Others None reported or observed

## 2023-12-10 NOTE — Progress Notes (Signed)
 [facility] MD Progress Note  12/10/2023 11:42 AM Jeffrey Sharp  MRN:  [id number] Principal Problem: Substance induced mood disorder (HCC) Diagnosis: Principal Problem:   Substance induced mood disorder (HCC) Active Problems:   Vitamin D  deficiency  History of Present Illness: Jeffrey Sharp, a 28 year old male presented to [place] emergency department on December 06, 2023, with concerns related to substance use.  He was brought in via EMS at the request of family members, who reported finding him unresponsive.  Upon attempting to administer Narcan, the patient was reportedly observed to be awake and alert, but his family remain concerned about ongoing drug use.  The patient reportedly admitted to recent use of crack cocaine and marijuana.  He does not have a documented psychiatric history but has a medical history notable for HSV infection.  He was subsequently admitted to the [facility] on the same day for further stabilization and treatment.   Chart review on admission: Per affidavit and petition "respondent stated to petitioner that he does not want to live anymore and that he can't take it, he wants to end it and not be here anymore. Respondent used cocaine and THC per petitioner. Respondent is hearing voices and does not make sense at times. Respondent is having erratic behavior and mood swings. Respondent told petitioner he hasn't slept in days and is doing any drug he can get his hands on. Showed up at petitioner's sister's house under influence of unknown drug and passed out and hit his head."   Chart review from last 24 hours: Chart reviewed today. Patient discussed at multidisciplinary team meeting. Vital signs within defined limit. Documented sleep hours: 7.75.  Nursing staff reported patient did not attend any groups during the day yesterday but was present for the evening group session, and has been attending all meals consistently.  He is compliant with routine psychotropic  medication regimen without difficulty. He did not require as needed medication overnight, according to the nursing record.    Yesterday the psychiatry team made the following recommendations: - Discontinue Seroquel  XR 100 mg, daily at bedtime due to patient's complaints of daytime drowsiness. - Initiate Seroquel  75 mg, daily at bedtime    Daily Evaluation: On today's assessment, the patient was observed lying in bed resting with eyes closed upon approach.  On awakening, he appeared drowsy but was cooperative.  When asked about his mood, he stated, "I am good."  He expressed readiness for discharge and appears preoccupied with leaving, stating he does not believe he needs Seroquel .  However, he denies experiencing any specific side effects from the medication today.  He expressed interest in staying busy at work and attending substance abuse classes after discharge.  He reports ongoing interest in substance use classes after participating in them during this admission.  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  He denies suicidal ideation, self-harm urges, homicidal ideation, or any psychotic symptoms.  He reports improved energy and concentration, and describes sleep and appetite as satisfactory.  Patient presents with improved mood, energy and concentration. Though he reports no safety concerns and is engaged in programming, he continues to exhibit poor insight into his mental health needs and medication management.  Persistent daytime drowsiness likely related to Seroquel , prompting dose adjustment.  The case was discussed with the attending psychiatrist, Catarina Cline.  Due to ongoing daytime drowsiness, Seroquel  will be decreased from 75 mg to 50 mg nightly in an effort to reduce sedation.  Continued hospitalization is  indicated to monitor clinical stability, ensure medication tolerability, and support ongoing insight development.  Projected discharge is early next week.  Total  Time spent with patient: 45 minutes  Past Psychiatric History: See H&P  Past Medical History: History reviewed. No pertinent past medical history. History reviewed. No pertinent surgical history. Family History: History reviewed. No pertinent family history. Family Psychiatric  History: See H&P Social History:  Social History   Substance and Sexual Activity  Alcohol Use Yes     Social History   Substance and Sexual Activity  Drug Use No    Social History   Socioeconomic History   Marital status: Single    Spouse name: Not on file   Number of children: Not on file   Years of education: Not on file   Highest education level: Not on file  Occupational History   Not on file  Tobacco Use   Smoking status: Every Day    Current packs/day: 0.50    Types: Cigarettes   Smokeless tobacco: Never  Substance and Sexual Activity   Alcohol use: Yes   Drug use: No   Sexual activity: Not on file  Other Topics Concern   Not on file  Social History Narrative   ** Merged History Encounter **       Social Drivers of Health   Financial Resource Strain: Not on file  Food Insecurity: Food Insecurity Present (12/07/2023)   Hunger Vital Sign    Worried About Running Out of Food in the Last Year: Often true    Ran Out of Food in the Last Year: Often true  Transportation Needs: Unmet Transportation Needs (12/07/2023)   PRAPARE - Administrator, Civil Service (Medical): Yes    Lack of Transportation (Non-Medical): Yes  Physical Activity: Not on file  Stress: Not on file  Social Connections: Not on file   Additional Social History:                          Current Medications: Current Facility-Administered Medications  Medication Dose Route Frequency Provider Last Rate Last Admin   acetaminophen  (TYLENOL ) tablet 650 mg  650 mg Oral Q6H PRN Weber, Kyra A, NP       alum & mag hydroxide-simeth (MAALOX/MYLANTA) 200-200-20 MG/5ML suspension 30 mL  30 mL Oral Q4H PRN  Weber, Kyra A, NP       haloperidol  (HALDOL ) tablet 5 mg  5 mg Oral TID PRN Weber, Kyra A, NP       And   diphenhydrAMINE  (BENADRYL ) capsule 50 mg  50 mg Oral TID PRN Weber, Kyra A, NP       haloperidol  lactate (HALDOL ) injection 5 mg  5 mg Intramuscular TID PRN Weber, Kyra A, NP       And   diphenhydrAMINE  (BENADRYL ) injection 50 mg  50 mg Intramuscular TID PRN Weber, Kyra A, NP       And   LORazepam  (ATIVAN ) injection 2 mg  2 mg Intramuscular TID PRN Weber, Kyra A, NP       haloperidol  lactate (HALDOL ) injection 10 mg  10 mg Intramuscular TID PRN Weber, Kyra A, NP       And   diphenhydrAMINE  (BENADRYL ) injection 50 mg  50 mg Intramuscular TID PRN Weber, Kyra A, NP       And   LORazepam  (ATIVAN ) injection 2 mg  2 mg Intramuscular TID PRN Weber, Kyra A, NP  feeding supplement (ENSURE ENLIVE / ENSURE PLUS) liquid 237 mL  237 mL Oral BID BM Attiah, Nadir, MD   237 mL at 12/09/23 1659   magnesium  hydroxide (MILK OF MAGNESIA) suspension 30 mL  30 mL Oral Daily PRN Weber, Kyra A, NP       nicotine  polacrilex (NICORETTE ) gum 2 mg  2 mg Oral PRN Onuoha, Chinwendu V, NP       QUEtiapine  (SEROQUEL ) tablet 50 mg  50 mg Oral QHS Lionardo Haze H, NP       Vitamin D  (Ergocalciferol ) (DRISDOL ) 1.25 MG (50000 UNIT) capsule 50,000 Units  50,000 Units Oral Daily Parker, Alvin S, MD   50,000 Units at 12/10/23 0803    Lab Results:  Results for orders placed or performed during the hospital encounter of 12/07/23 (from the past 48 hours)  Folate     Status: None   Collection Time: 12/08/23  6:29 PM  Result Value Ref Range   Folate 9.7 >5.9 ng/mL    Comment: Performed at [facility], [address], [place], [place] [address]  RPR     Status: None   Collection Time: 12/08/23  6:29 PM  Result Value Ref Range   RPR Ser Ql NON REACTIVE NON REACTIVE    Comment: Performed at [facility], [address], [place], [place] [address]  Vitamin B12     Status: None   Collection  Time: 12/08/23  6:29 PM  Result Value Ref Range   Vitamin B-12 216 180 - 914 pg/mL    Comment: (NOTE) This assay is not validated for testing neonatal or myeloproliferative syndrome specimens for Vitamin B12 levels. Performed at [facility], [address] [place], [place] [address]   VITAMIN D  25 Hydroxy (Vit-D Deficiency, Fractures)     Status: Abnormal   Collection Time: 12/08/23  6:29 PM  Result Value Ref Range   Vit D, 25-Hydroxy 14.16 (L) 30 - 100 ng/mL    Comment: (NOTE) Vitamin D  deficiency has been defined by the Institute of Medicine  and an Endocrine Society practice guideline as a level of serum 25-OH  vitamin D  less than 20 ng/mL (1,2). The Endocrine Society went on to  further define vitamin D  insufficiency as a level between 21 and 29  ng/mL (2).  1. IOM (Institute of Medicine). 2010. Dietary reference intakes for  calcium and D. [place]: [employer]. 2. Holick MF, Binkley Scobey, Bischoff-Ferrari HA, et al. Evaluation,  treatment, and prevention of vitamin D  deficiency: an Endocrine  Society clinical practice guideline, JCEM. 2011 Jul; 96(7): 1911-30.  Performed at [facility], [address] [place], [place] [address]   Hemoglobin A1c     Status: None   Collection Time: 12/08/23  6:29 PM  Result Value Ref Range   Hgb A1c MFr Bld 5.3 4.8 - 5.6 %    Comment: (NOTE)         Prediabetes: 5.7 - 6.4         Diabetes: >6.4         Glycemic control for adults with diabetes: <7.0    Mean Plasma Glucose 105 mg/dL    Comment: (NOTE) Performed At: [facility] [address] [place], [place] [id number] Pearlean Botts MD [id number]   Lipid panel     Status: Abnormal   Collection Time: 12/08/23  6:29 PM  Result Value Ref Range   Cholesterol 155 0 - 200 mg/dL  Triglycerides 189 (H) <150 mg/dL   HDL 53 >81 mg/dL   Total CHOL/HDL Ratio 2.9 RATIO   VLDL 38 0 - 40 mg/dL   LDL Cholesterol 64 0 - 99 mg/dL     Comment:        Total Cholesterol/HDL:CHD Risk Coronary Heart Disease Risk Table                     Men   Women  1/2 Average Risk   3.4   3.3  Average Risk       5.0   4.4  2 X Average Risk   9.6   7.1  3 X Average Risk  23.4   11.0        Use the calculated Patient Ratio above and the CHD Risk Table to determine the patient's CHD Risk.        ATP III CLASSIFICATION (LDL):  <100     mg/dL   Optimal  191-478  mg/dL   Near or Above                    Optimal  130-159  mg/dL   Borderline  295-621  mg/dL   High  >308     mg/dL   Very High Performed at [facility], [address], [place], [place] [address]   T4, free     Status: None   Collection Time: 12/08/23  6:29 PM  Result Value Ref Range   Free T4 0.74 0.61 - 1.12 ng/dL    Comment: (NOTE) Biotin ingestion may interfere with free T4 tests. If the results are inconsistent with the TSH level, previous test results, or the clinical presentation, then consider biotin interference. If needed, order repeat testing after stopping biotin. Performed at [facility], [address] [place], [place] [address]   T3, free     Status: None   Collection Time: 12/08/23  6:29 PM  Result Value Ref Range   T3, Free 3.8 2.0 - 4.4 pg/mL    Comment: (NOTE) Performed At: [facility] [address] [place], [place] [id number] Pearlean Botts MD [id number]   HIV Antibody (routine testing w rflx)     Status: None   Collection Time: 12/08/23  6:29 PM  Result Value Ref Range   HIV Screen 4th Generation wRfx Non Reactive Non Reactive    Comment: Performed at [facility], [address], [place], [place] [address]    Blood Alcohol level:  Lab Results  Component Value Date   ETH <10 12/07/2023    Metabolic Disorder Labs: Lab Results  Component Value Date   HGBA1C 5.3 12/08/2023   MPG 105 12/08/2023   No results found for: "PROLACTIN" Lab Results  Component Value Date   CHOL 155  12/08/2023   TRIG 189 (H) 12/08/2023   HDL 53 12/08/2023   CHOLHDL 2.9 12/08/2023   VLDL 38 12/08/2023   LDLCALC 64 12/08/2023    Physical Findings: AIMS:  , ,  0,  ,    CIWA:   N/A COWS:   N/A  Musculoskeletal: Strength & Muscle Tone: within normal limits Gait & Station: normal Patient leans: N/A  Psychiatric Specialty Exam:  Presentation  General Appearance:  Appropriate for Environment  Eye Contact: Good  Speech: Clear and Coherent; Normal Rate  Speech Volume: Normal  Handedness:No data recorded  Mood and Affect  Mood: Euthymic  Affect: Congruent   Thought Process  Thought Processes: Coherent  Descriptions of Associations:Intact  Orientation:Full (Time, Place and Person)  Thought Content:Logical  History of Schizophrenia/Schizoaffective disorder:No data recorded Duration of Psychotic Symptoms:No data recorded Hallucinations:No data recorded  Ideas of Reference:None  Suicidal Thoughts:No data recorded  Homicidal Thoughts:No data recorded   Sensorium  Memory: Immediate Fair  Judgment: Poor  Insight: Poor   Executive Functions  Concentration: Fair  Attention Span: Fair  Recall: Fair  Fund of Knowledge: Fair  Language: Fair   Psychomotor Activity  Psychomotor Activity: No data recorded   Assets  Assets: Physical Health   Sleep  Sleep: No data recorded    Physical Exam: Physical Exam Vitals and nursing note reviewed.  Constitutional:      General: He is not in acute distress.    Appearance: He is not ill-appearing.  Cardiovascular:     Rate and Rhythm: Tachycardia present.  Pulmonary:     Effort: No respiratory distress.  Neurological:     Mental Status: He is alert and oriented to person, place, and time.    ROS Blood pressure 129/85, pulse 62, temperature 98 F (36.7 C), temperature source Oral, resp. rate 18, height 5\' 6"  (1.676 m), weight 51.7 kg, SpO2 98%. Body mass index is 18.4  kg/m.   Treatment Plan Summary: Daily contact with patient to assess and evaluate symptoms and progress in treatment and Medication management   ASSESSMENT: The patient is a 28 year old male presenting with significant substance use issues, primarily crack cocaine and cannabis, alongside a history of ADHD and borderline personality traits. The patient reports daily use of crack cocaine for the past two months and regular cannabis use, which has led to psychotic symptoms including auditory and visual hallucinations and paranoia. He has a history of ADHD diagnosed in childhood, previously treated with Vyvanse, which he discontinued at age 27. He denies any current medications or allergies. He reports poor sleep patterns and manic symptoms such as staying awake for extended periods under the influence of substances. Psychologically, the patient exhibits borderline personality traits such as fear of abandonment, unstable relationships, and chronic feelings of emptiness. He reports social anxiety and psychotic symptoms under the influence, including paranoia and hallucinations.    Diagnoses / Active Problems: Principal Problem:   Substance induced mood disorder (HCC) Active Problems:   Vitamin D  deficiency              PLAN: Safety and Monitoring: --  Involuntary admission to inpatient psychiatric unit for safety, stabilization and treatment -- Daily contact with patient to assess and evaluate symptoms and progress in treatment             -- Patient's case to be discussed in multi-disciplinary team meeting             -- Observation Level : q15 minute checks             -- Vital signs:  q12 hours             -- Precautions: suicide, elopement, and assault   2. Psychiatric Diagnoses and Treatment:    #Substance Induced Mood Disorder -- Discontinue Seroquel  XR 100 mg, daily at bedtime due to patient's complaints of daytime drowsiness (12/09/23)  -- Decrease Seroquel  from 75 mg to 50 mg,  nightly due to persistent daytime drowsiness     --  The risks/benefits/side-effects/alternatives to this medication were discussed in detail with the patient and time was given for questions. The patient consents to medication trial.  -- FDA --  Metabolic profile and EKG monitoring obtained while on an atypical antipsychotic (BMI: Lipid Panel: HbgA1c: QTc:)    #Continue As Needed Medications --Hydroxyzine 25 mg oral, 3 times daily as needed, anxiety --Trazodone 50 mg, oral, daily at bedtime as needed, sleep   #Continue BH Agitation Protocol --Haldol  5 mg, oral, 3 times daily as needed, mild agitation --Benadryl  50 mg, oral, 3 times daily as needed, mild agitation                                     OR  --Haldol  injection 5 mg, IM, 3 times daily as needed, moderate                 agitation --Benadryl  injection 50 mg, IM, 3 times daily as needed, moderate agitation --Ativan  injection 2 mg, IM, 3 times daily as needed, moderate agitation                                     OR --Haldol  injection 10 mg, IM, 3 times daily as needed, severe           agitation --Benadryl  injection 50 mg, IM, 3 times daily as needed, severe agitation --Ativan  injection 2 mg, IM, 3 times daily as needed, severe agitation     -- Encouraged patient to participate in unit milieu and in scheduled group therapies  -- Short Term Goals: Ability to identify changes in lifestyle to reduce recurrence of condition will improve, Ability to verbalize feelings will improve, Ability to disclose and discuss suicidal ideas, Ability to demonstrate self-control will improve, Ability to identify and develop effective coping behaviors will improve, Ability to maintain clinical measurements within normal limits will improve, Compliance with prescribed medications will improve, and Ability to identify triggers associated with substance abuse/mental health issues will improve  -- Long Term Goals: Improvement in symptoms so as ready for  discharge                3. Medical Issues Being Addressed:                  #Vitamin D  Deficiency  -- Continue Vitamin D  50,000 units, daily, for 7 days starting 12/09/23 as ordered by attending psychiatrist.   #Tobacco Use Disorder             -- Nicorette  gum 2 mg as needed  -- Declined Nicotine  patch              -- Smoking cessation encouraged   #Continue As Needed Medications --Tylenol  650 mg every 6 hours, as needed, mild pain, fever --Maalox/Mylanta 30 mL oral every 4 hours as needed, indigestion --Milk of Magnesia 30 mL oral daily as needed, mild constipation   4. Labs    CMP: Unremarkable CBC: RBC^ Otherwise within range TSH : 4.891^           Labs ordered by attending for 12/09/2023  Labs reviewed today:  RPR  HIV Antibody: Non Reactive T3  T4  Folate  Hemoglobin A1c  Vitamin B12: All within range  Lipid panel: Triglycerides 189^ Vitamin D : Low at 14.16    UDS: Positive for Cocaine, Barbiturates   EKG: QT/QTcB 385/416     5. Discharge Planning:  -- Social work and case management to assist with discharge planning and identification of hospital follow-up needs prior  to discharge             -- Estimated LOS: 5-7 days -- Discharge Concerns: Need to establish a safety plan; Medication compliance and effectiveness -- Discharge Goals: Return home with outpatient referrals for mental health follow-up including medication management/psychotherapy      I certify that inpatient services furnished can reasonably be expected to improve the patient's condition.     Lennard Quirk, NP 12/10/2023, 11:42 AM Patient ID: Jeffrey Sharp, male   DOB: 1996/06/27, 28 y.o.   MRN: [id number]

## 2023-12-10 NOTE — Group Note (Signed)
  LCSW Group Therapy Note  Group Date: 12/10/2023 Start Time: 10:00am End Time: 11:10am  Group Therapy:   Anger Management Skills  Participation Level:  Did Not Attend  Description of Group:   In this group, patients learned how to recognize the physical, cognitive, emotional, and behavioral responses they have to anger-provoking situations.  They identified a recent time they became angry and how they reacted.  They analyzed how their reaction was possibly beneficial and how it was possibly unhelpful.  The group discussed a variety of healthier coping skills that could help with such a situation in the future.  Focus was placed on how helpful it is to recognize the underlying emotions to our anger, because working on those can lead to a more permanent solution as well as our ability to focus on the important rather than the urgent.  The group explored the issue of boundaries and long-term resentments as related to their anger.  CSW taught briefly about states of mind and reviewed TIPP skills, practicing a couple of these with the group as a whole.   Therapeutic Goals: Patients will identify someone/something that typically triggers them to anger as well as their typical coping skill, whether healthy or unhealthy. Patients will learn that anger is a secondary emotion and will see how working on the primary emotion is beneficial in the long-term to deal with angry reactions. Patients will learn basic CBT concept of Thought-Feeling-Action sequence and how helpful it is to challenge the thought in order to have a different feeling and behavior. Patients will be introduced to DBT concept of Emotional/Rational/Wise Mind and how to use TIPP skills to rapidly exit emotional state. Patients will explore possible new behaviors to use in future anger situations.   Summary of Patient Progress:  Patient was invited to group, did not attend.   Therapeutic Modalities:   Cognitive Behavioral  Therapy Dialectical Behavioral Therapy Processing  Ancel Kass, LCSW 12/10/2023  12:52 PM

## 2023-12-10 NOTE — Plan of Care (Signed)
   Problem: Education: Goal: Emotional status will improve Outcome: Progressing Goal: Mental status will improve Outcome: Progressing

## 2023-12-10 NOTE — Progress Notes (Signed)
   12/10/23 1300  Psych Admission Type (Psych Patients Only)  Admission Status Involuntary  Psychosocial Assessment  Patient Complaints None  Eye Contact Fair  Facial Expression Animated  Affect Appropriate to circumstance  Speech Logical/coherent  Interaction Assertive  Motor Activity Fidgety  Appearance/Hygiene Disheveled  Behavior Characteristics Appropriate to situation;Cooperative  Mood Anxious;Pleasant  Thought Process  Coherency WDL  Content WDL  Delusions None reported or observed  Perception WDL  Hallucination None reported or observed  Judgment Impaired  Confusion None  Danger to Self  Current suicidal ideation? Denies  Self-Injurious Behavior No self-injurious ideation or behavior indicators observed or expressed   Danger to Others  Danger to Others None reported or observed

## 2023-12-11 DIAGNOSIS — F3181 Bipolar II disorder: Secondary | ICD-10-CM | POA: Diagnosis not present

## 2023-12-11 MED ORDER — ENSURE ENLIVE PO LIQD: 237.0000 mL | Freq: Two times a day (BID) | ORAL | Status: AC

## 2023-12-11 MED ORDER — VITAMIN D (ERGOCALCIFEROL) 1.25 MG (50000 UNIT) PO CAPS: 50000.0000 [IU] | ORAL_CAPSULE | Freq: Every day | ORAL | 0 refills | Status: AC

## 2023-12-11 MED ORDER — QUETIAPINE FUMARATE 50 MG PO TABS: 50.0000 mg | ORAL_TABLET | Freq: Every day | ORAL | 0 refills | Status: AC

## 2023-12-11 NOTE — Group Note (Signed)
  LCSW Group Therapy Note  Group Date: 12/11/2023  Start Time: 10:00am End Time: 11:10am  Group Therapy:   Boundaries  Participation Level:  Active  Description of Group: This group was used to explore boundary styles (porous, rigid, healthy), boundary types (physical, emotional, intellectual, sexual, material, time), basics about setting boundaries, possible phrases to use, and guidelines of how to approach someone by planning ahead, being respectful, using confident body language, and compromising.  Handouts were used to further reinforce teaching and to remind patients after hospitalization.  Patients identified current problems with setting boundaries in their own lives and discussed what they have tried to date.  Patients were encouraged to ask questions, express concerns, and bring up specific situations for group discussion.  Humor and role play were used to further healthy discussion on the topic.  Therapeutic Goals:  1.  Patient will identify one areas in their life where setting clear boundaries have been needed but unsuccessful to date.   2.  Patient will learn about boundary styles, boundary types, and setting boundaries. 3.  Patient will be provided with multiple illustrations and examples of healthy boundary setting and how to make corrections when boundaries are violated 4.  Patient will demonstrate ability to recognize the need for boundaries and make a plan on how to set boundaries in vignettes provided.  Summary of Patient Progress:   Patient demonstrated good insight into the subject matter of boundaries, was respectful of peers, and was attentive.  He shared that one difficulty with boundaries currently experienced is with his own addiction and to date he has tried "not using" with limited success.    Therapeutic Modalities:   Psychoeducation Solution-focused Processing   Ancel Kass, LCSW 12/11/2023  12:04 PM

## 2023-12-11 NOTE — Discharge Summary (Signed)
 Physician Discharge Summary Note  Patient:  Jeffrey Sharp is an 28 y.o., male MRN:  [id number] DOB:  1996-01-29 Patient phone:  [phone] (home)  Patient address:   [address] [place] [address],  Total Time spent with patient: 45 minutes  Date of Admission:  12/07/2023 Date of Discharge: 12/11/23   Reason for Admission:  Jeffrey Sharp, a 28 year old male presented to [facility] emergency department on December 06, 2023, with concerns related to substance use. He was brought in via EMS at the request of family members, who reported finding him unresponsive. Upon attempting to administer Narcan, the patient was reportedly observed to be awake and alert, but his family remain concerned about ongoing drug use. The patient reportedly admitted to recent use of crack cocaine and marijuana. He does not have a documented psychiatric history but has a medical history notable for HSV infection. He was subsequently admitted to the [facility] on the same day for further stabilization and treatment.    Principal Problem: Substance induced mood disorder [facility]) Discharge Diagnoses: Principal Problem:   Substance induced mood disorder (HCC) Active Problems:   Vitamin D  deficiency   Past Psychiatric History: See H&P   Past Medical History: History reviewed. No pertinent past medical history. History reviewed. No pertinent surgical history. Family History: History reviewed. No pertinent family history. Family Psychiatric  History: See H&P  Social History:  Social History   Substance and Sexual Activity  Alcohol Use Yes     Social History   Substance and Sexual Activity  Drug Use No    Social History   Socioeconomic History   Marital status: Single    Spouse name: Not on file   Number of children: Not on file   Years of education: Not on file   Highest education level: Not on file  Occupational History   Not on file  Tobacco Use   Smoking status: Every Day     Current packs/day: 0.50    Types: Cigarettes   Smokeless tobacco: Never  Substance and Sexual Activity   Alcohol use: Yes   Drug use: No   Sexual activity: Not on file  Other Topics Concern   Not on file  Social History Narrative   ** Merged History Encounter **       Social Drivers of Health   Financial Resource Strain: Not on file  Food Insecurity: Food Insecurity Present (12/07/2023)   Hunger Vital Sign    Worried About Running Out of Food in the Last Year: Often true    Ran Out of Food in the Last Year: Often true  Transportation Needs: Unmet Transportation Needs (12/07/2023)   PRAPARE - Administrator, Civil Service (Medical): Yes    Lack of Transportation (Non-Medical): Yes  Physical Activity: Not on file  Stress: Not on file  Social Connections: Not on file    Hospital Course:  During the patient's hospitalization, patient had extensive initial psychiatric evaluation, and follow-up psychiatric evaluations every day.  Psychiatric diagnoses upon initial assessment: Substance induced mood disorder (HCC)  Patient's psychiatric medications were adjusted on admission: The patient was started on Seroquel  XR 100 mg.  Lithium  and naltrexone  were discontinued due to the patient's persistent refusal to take these medications.   During the hospitalization, other adjustments were made to the patient's psychiatric medication regimen: Seroquel  was decreased to 50 mg nightly in an effort to reduce daytime sedation  Patient's care was discussed during the interdisciplinary team meeting every day  during the hospitalization.  The patient reported experiencing daytime sleepiness, which he attributed to Seroquel .  The Seroquel  dosage was adjusted in an effort to reduce sedation and improve daytime alertness.  Gradually, patient started adjusting to milieu. The patient was evaluated each day by a clinical provider to ascertain response to treatment. Improvement was noted by the  patient's report of decreasing symptoms, improved sleep and appetite, affect, medication tolerance, behavior, and participation in unit programming.  Patient was asked each day to complete a self inventory noting mood, mental status, pain, new symptoms, anxiety and concerns.    Symptoms were reported as significantly decreased or resolved completely by discharge.   On day of discharge, the patient reports that their mood is stable. The patient denied having suicidal thoughts for more than 48 hours prior to discharge.  Patient denies having homicidal thoughts.  Patient denies having auditory hallucinations.  Patient denies any visual hallucinations or other symptoms of psychosis. The patient was motivated to continue taking medication with a goal of continued improvement in mental health.   The patient reports their target psychiatric symptoms related to substance use, responded well to the psychiatric medications, and the patient reports overall benefit other psychiatric hospitalization. Supportive psychotherapy was provided to the patient. The patient also participated in regular group therapy while hospitalized. Coping skills, problem solving as well as relaxation therapies were also part of the unit programming.  Labs were reviewed with the patient, and abnormal results were discussed with the patient.  The patient is able to verbalize their individual safety plan to this provider.  # It is recommended to the patient to continue psychiatric medications as prescribed, after discharge from the hospital.    # It is recommended to the patient to follow up with your outpatient psychiatric provider and PCP.  # It was discussed with the patient, the impact of alcohol, drugs, tobacco have been there overall psychiatric and medical wellbeing, and total abstinence from substance use was recommended the patient.ed.  # Prescriptions provided or sent directly to preferred pharmacy at discharge. Patient  agreeable to plan. Given opportunity to ask questions. Appears to feel comfortable with discharge.    # In the event of worsening symptoms, the patient is instructed to call the crisis hotline, 911 and or go to the nearest ED for appropriate evaluation and treatment of symptoms. To follow-up with primary care provider for other medical issues, concerns and or health care needs  # Patient was discharged home with a plan to follow up as noted below.   Physical Findings: AIMS:  , , 0 ,  ,    CIWA:   N/A COWS:   N/A  Musculoskeletal: Strength & Muscle Tone: within normal limits Gait & Station: normal Patient leans: N/A   Psychiatric Specialty Exam:  Presentation  General Appearance:  Casual  Eye Contact: Good  Speech: Clear and Coherent; Normal Rate  Speech Volume: Normal  Handedness:No data recorded  Mood and Affect  Mood: Euthymic  Affect: Appropriate; Congruent   Thought Process  Thought Processes: Coherent; Goal Directed; Linear  Descriptions of Associations:Intact  Orientation:Full (Time, Place and Person)  Thought Content:Logical; WDL  History of Schizophrenia/Schizoaffective disorder:No data recorded Duration of Psychotic Symptoms:No data recorded Hallucinations:Hallucinations: None  Ideas of Reference:None  Suicidal Thoughts:Suicidal Thoughts: No  Homicidal Thoughts:Homicidal Thoughts: No   Sensorium  Memory: Immediate Good; Recent Good; Remote Good  Judgment: Intact  Insight: Present   Executive Functions  Concentration: Good  Attention Span: Good  Recall: Good  Fund of Knowledge: Good  Language: Good   Psychomotor Activity  Psychomotor Activity: Psychomotor Activity: Normal   Assets  Assets: Communication Skills; Desire for Improvement; Physical Health; Resilience; Social Support; Transportation   Sleep  Sleep: Sleep: Good    Physical Exam: Physical Exam Vitals and nursing note reviewed.   Constitutional:      General: He is not in acute distress.    Appearance: He is not ill-appearing.  Cardiovascular:     Rate and Rhythm: Normal rate.     Pulses: Normal pulses.  Pulmonary:     Effort: No respiratory distress.  Neurological:     General: No focal deficit present.     Mental Status: He is alert and oriented to person, place, and time.  Psychiatric:        Mood and Affect: Mood normal.        Behavior: Behavior normal.        Thought Content: Thought content normal.        Judgment: Judgment normal.    Review of Systems  Constitutional: Negative.   Respiratory:  Negative for shortness of breath.   Cardiovascular:  Negative for chest pain and palpitations.  Gastrointestinal: Negative.   Skin: Negative.   Neurological: Negative.   Psychiatric/Behavioral:  Positive for substance abuse. Negative for depression, hallucinations, memory loss and suicidal ideas. The patient is not nervous/anxious and does not have insomnia.    Blood pressure 135/89, pulse 94, temperature 98.4 F (36.9 C), temperature source Oral, resp. rate 18, height 5\' 6"  (1.676 m), weight 51.7 kg, SpO2 100%. Body mass index is 18.4 kg/m.   Social History   Tobacco Use  Smoking Status Every Day   Current packs/day: 0.50   Types: Cigarettes  Smokeless Tobacco Never   Tobacco Cessation:  N/A, patient does not currently use tobacco products   Blood Alcohol level:  Lab Results  Component Value Date   ETH <10 12/07/2023    Metabolic Disorder Labs:  Lab Results  Component Value Date   HGBA1C 5.3 12/08/2023   MPG 105 12/08/2023   No results found for: "PROLACTIN" Lab Results  Component Value Date   CHOL 155 12/08/2023   TRIG 189 (H) 12/08/2023   HDL 53 12/08/2023   CHOLHDL 2.9 12/08/2023   VLDL 38 12/08/2023   LDLCALC 64 12/08/2023    See Psychiatric Specialty Exam and Suicide Risk Assessment completed by Attending Physician prior to discharge.  Discharge destination:  Home  Is  patient on multiple antipsychotic therapies at discharge:  No   Has Patient had three or more failed trials of antipsychotic monotherapy by history:  No  Recommended Plan for Multiple Antipsychotic Therapies: NA  Discharge Instructions     Activity as tolerated - No restrictions   Complete by: As directed    Diet - low sodium heart healthy   Complete by: As directed       Allergies as of 12/11/2023   No Known Allergies      Medication List     TAKE these medications      Indication  feeding supplement Liqd Take 237 mLs by mouth 2 (two) times daily between meals.  Indication: Nutritional Support   QUEtiapine  50 MG tablet Commonly known as: SEROQUEL  Take 1 tablet (50 mg total) by mouth at bedtime.  Indication: Mood   Vitamin D  (Ergocalciferol ) 1.25 MG (50000 UNIT) Caps capsule Commonly known as: DRISDOL  Take 1 capsule (50,000 Units total) by mouth daily. Start taking on: December 12, 2023  Indication: Vitamin D  Deficiency         Follow-up recommendations:   Activity: as tolerated  Diet: heart healthy  Other: -Follow-up with your outpatient psychiatric provider -instructions on appointment date, time, and address (location) are provided to you in discharge paperwork.  -Take your psychiatric medications as prescribed at discharge - instructions are provided to you in the discharge paperwork  -Follow-up with outpatient primary care doctor and other specialists -for management of preventative medicine and chronic medical disease: Vitamin D  deficiency   -Testing: Follow-up with outpatient provider for abnormal lab results: Low Vitamin D  level  Elevated Triglycerides  Elevated TSH    -If you are prescribed an atypical antipsychotic medication, we recommend that your outpatient psychiatrist follow routine screening for side effects within 3 months of discharge, including monitoring: AIMS scale, height, weight, blood pressure, fasting lipid panel, HbA1c, and fasting  blood sugar.   -Recommend total abstinence from alcohol, tobacco, and other illicit drug use at discharge.   -If your psychiatric symptoms recur, worsen, or if you have side effects to your psychiatric medications, call your outpatient psychiatric provider, 911, [phone] or go to the nearest emergency department.  -If suicidal thoughts occur, immediately call your outpatient psychiatric provider, 911, [phone] or go to the nearest emergency department.     Signed: Lennard Quirk, NP 12/11/2023, 10:09 AM

## 2023-12-11 NOTE — Group Note (Signed)
 Date:  12/11/2023 Time:  9:07 AM  Group Topic/Focus:  Goals Group:   The focus of this group is to help patients establish daily goals to achieve during treatment and discuss how the patient can incorporate goal setting into their daily lives to aide in recovery. Orientation:   The focus of this group is to educate the patient on the purpose and policies of crisis stabilization and provide a format to answer questions about their admission.  The group details unit policies and expectations of patients while admitted.    Participation Level:  Did Not Attend

## 2023-12-11 NOTE — Progress Notes (Signed)
 Adult Psychoeducational Group Note  Date:  12/11/2023 Time:  12:14 AM  Group Topic/Focus:  Wrap-Up Group:   The focus of this group is to help patients review their daily goal of treatment and discuss progress on daily workbooks.  Participation Level:  Active  Participation Quality:  Appropriate  Affect:  Appropriate  Cognitive:  Appropriate  Insight: Appropriate  Engagement in Group:  Engaged  Modes of Intervention:  Discussion  Additional Comments:  Pt stated he had a good day.  Pt goal for the day was to socialize with his peers.  Pt met goal.  Aquilla Bayley 12/11/2023, 12:14 AM

## 2023-12-11 NOTE — Progress Notes (Signed)
  [facility] Adult Case Management Discharge Plan :  Will you be returning to the same living situation after discharge:  Yes,  Patient to return home.  At discharge, do you have transportation home?: Yes,  CSW arranged taxi services on patient's behalf.  Do you have the ability to pay for your medications: Yes, [place] MEDICAID PREPAID HEALTH PLAN / [place] MEDICAID [facility]   Release of information consent forms completed and in the chart;  Patient's signature needed at discharge.  Patient to Follow up at:   Next level of care provider has access to [facility] Link:no  Safety Planning and Suicide Prevention discussed: No. Patient declined. SPE completed with pt, as pt refused to consent to family contact. SPI pamphlet provided to pt and pt was encouraged to share information with support network, ask questions, and talk about any concerns relating to SPE. Pt denies access to guns/firearms and verbalized understanding of information provided. Mobile Crisis information also provided to pt.       Has patient been referred to the Quitline?: Patient refused referral for treatment  Patient has been referred for addiction treatment: Patient refused referral for treatment; referral information given to patient at discharge.  Roselle Conner, LCSW 12/11/2023, 9:31 AM

## 2023-12-11 NOTE — Progress Notes (Signed)
Pt discharged to lobby with taxi voucher. Pt was stable and appreciative at that time. All papers and electronic prescriptions were given and valuables returned. Verbal understanding expressed. Denies SI/HI and A/VH. Pt given opportunity to express concerns and ask questions.  

## 2023-12-11 NOTE — [facility] Suicide Risk Assessment (Signed)
 Suicide Risk Assessment  Discharge Assessment    [facility] Discharge Suicide Risk Assessment   Principal Problem: Substance induced mood disorder (HCC) Discharge Diagnoses: Principal Problem:   Substance induced mood disorder (HCC) Active Problems:   Vitamin D  deficiency   Reason for Admission: Jeffrey Sharp, a 28 year old male presented to [facility] emergency department on December 06, 2023, with concerns related to substance use. He was brought in via EMS at the request of family members, who reported finding him unresponsive. Upon attempting to administer Narcan, the patient was reportedly observed to be awake and alert, but his family remain concerned about ongoing drug use. The patient reportedly admitted to recent use of crack cocaine and marijuana. He does not have a documented psychiatric history but has a medical history notable for HSV infection. He was subsequently admitted to the [facility] on the same day for further stabilization and treatment.   Total Time spent with patient: 45 minutes  Musculoskeletal: Strength & Muscle Tone: within normal limits Gait & Station: normal Patient leans: N/A  Psychiatric Specialty Exam  Presentation  General Appearance:  Casual  Eye Contact: Good  Speech: Clear and Coherent; Normal Rate  Speech Volume: Normal  Handedness:No data recorded  Mood and Affect  Mood: Euthymic  Duration of Depression Symptoms: No data recorded Affect: Appropriate; Congruent   Thought Process  Thought Processes: Coherent; Goal Directed; Linear  Descriptions of Associations:Intact  Orientation:Full (Time, Place and Person)  Thought Content:Logical; WDL  History of Schizophrenia/Schizoaffective disorder:No data recorded Duration of Psychotic Symptoms:No data recorded Hallucinations:Hallucinations: None  Ideas of Reference:None  Suicidal Thoughts:Suicidal Thoughts: No  Homicidal Thoughts:Homicidal Thoughts:  No   Sensorium  Memory: Immediate Good; Recent Good; Remote Good  Judgment: Intact  Insight: Present   Executive Functions  Concentration: Good  Attention Span: Good  Recall: Good  Fund of Knowledge: Good  Language: Good   Psychomotor Activity  Psychomotor Activity: Psychomotor Activity: Normal   Assets  Assets: Communication Skills; Desire for Improvement; Physical Health; Resilience; Social Support; Transportation   Sleep  Sleep: Sleep: Good   Physical Exam: Physical Exam ROS Blood pressure 135/89, pulse 94, temperature 98.4 F (36.9 C), temperature source Oral, resp. rate 18, height 5\' 6"  (1.676 m), weight 51.7 kg, SpO2 100%. Body mass index is 18.4 kg/m.  Mental Status Per Nursing Assessment::   On Admission:  Suicidal ideation indicated by others  Demographic Factors:  Low socioeconomic status  Loss Factors: Financial problems/change in socioeconomic status  Historical Factors: Prior suicide attempts  Risk Reduction Factors:   Religious beliefs about death, Living with another person, especially a relative, Positive social support, Positive therapeutic relationship, and Positive coping skills or problem solving skills   Cognitive Features That Contribute To Risk:  None    Suicide Risk:  Mild:  Suicidal ideation of limited frequency, intensity, duration, and specificity.  There are no identifiable plans, no associated intent, mild dysphoria and related symptoms, good self-control (both objective and subjective assessment), few other risk factors, and identifiable protective factors, including available and accessible social support.    Plan Of Care/Follow-up recommendations:   Activity: as tolerated  Diet: heart healthy  Other: -Follow-up with your outpatient psychiatric provider -instructions on appointment date, time, and address (location) are provided to you in discharge paperwork.  -Take your psychiatric medications as  prescribed at discharge - instructions are provided to you in the discharge paperwork  -Follow-up with outpatient primary care doctor and other specialists -for management of preventative medicine and chronic  medical disease: Vitamin D  deficiency   -Testing: Follow-up with outpatient provider for abnormal lab results: Low Vitamin D  level  Elevated Triglycerides  Elevated TSH    -If you are prescribed an atypical antipsychotic medication, we recommend that your outpatient psychiatrist follow routine screening for side effects within 3 months of discharge, including monitoring: AIMS scale, height, weight, blood pressure, fasting lipid panel, HbA1c, and fasting blood sugar.   -Recommend total abstinence from alcohol, tobacco, and other illicit drug use at discharge.   -If your psychiatric symptoms recur, worsen, or if you have side effects to your psychiatric medications, call your outpatient psychiatric provider, 911, [phone] or go to the nearest emergency department.  -If suicidal thoughts occur, immediately call your outpatient psychiatric provider, 911, 988 or go to the nearest emergency department.   Lennard Quirk, NP 12/11/2023, 10:12 AM

## 2023-12-11 NOTE — Progress Notes (Signed)
   12/11/23 0900  Psych Admission Type (Psych Patients Only)  Admission Status Involuntary  Psychosocial Assessment  Patient Complaints None  Eye Contact Fair  Facial Expression Animated  Affect Appropriate to circumstance  Speech Logical/coherent  Interaction Assertive  Motor Activity Other (Comment) (steady gait)  Appearance/Hygiene Unremarkable  Behavior Characteristics Appropriate to situation;Cooperative;Calm  Mood Pleasant  Thought Process  Coherency WDL  Content WDL  Delusions None reported or observed  Perception WDL  Hallucination None reported or observed  Judgment Impaired  Confusion None  Danger to Self  Current suicidal ideation? Denies  Self-Injurious Behavior No self-injurious ideation or behavior indicators observed or expressed   Danger to Others  Danger to Others None reported or observed
